# Patient Record
Sex: Male | Born: 1955
Health system: Southern US, Community
[De-identification: ages and names within clinical notes are randomized; demographics above are authoritative.]

## PROBLEM LIST (undated history)

## (undated) DIAGNOSIS — C61 Malignant neoplasm of prostate: Secondary | ICD-10-CM

## (undated) DIAGNOSIS — E049 Nontoxic goiter, unspecified: Secondary | ICD-10-CM

## (undated) DIAGNOSIS — M1990 Unspecified osteoarthritis, unspecified site: Secondary | ICD-10-CM

## (undated) DIAGNOSIS — G4730 Sleep apnea, unspecified: Secondary | ICD-10-CM

## (undated) DIAGNOSIS — E119 Type 2 diabetes mellitus without complications: Secondary | ICD-10-CM

## (undated) DIAGNOSIS — H5702 Anisocoria: Secondary | ICD-10-CM

## (undated) DIAGNOSIS — Z973 Presence of spectacles and contact lenses: Secondary | ICD-10-CM

## (undated) DIAGNOSIS — K648 Other hemorrhoids: Secondary | ICD-10-CM

## (undated) DIAGNOSIS — K5790 Diverticulosis of intestine, part unspecified, without perforation or abscess without bleeding: Secondary | ICD-10-CM

## (undated) DIAGNOSIS — E039 Hypothyroidism, unspecified: Secondary | ICD-10-CM

## (undated) DIAGNOSIS — Z87442 Personal history of urinary calculi: Secondary | ICD-10-CM

## (undated) DIAGNOSIS — C4491 Basal cell carcinoma of skin, unspecified: Secondary | ICD-10-CM

## (undated) DIAGNOSIS — R7303 Prediabetes: Secondary | ICD-10-CM

## (undated) DIAGNOSIS — T148XXA Other injury of unspecified body region, initial encounter: Secondary | ICD-10-CM

## (undated) DIAGNOSIS — F988 Other specified behavioral and emotional disorders with onset usually occurring in childhood and adolescence: Secondary | ICD-10-CM

## (undated) DIAGNOSIS — E785 Hyperlipidemia, unspecified: Secondary | ICD-10-CM

## (undated) DIAGNOSIS — R9431 Abnormal electrocardiogram [ECG] [EKG]: Secondary | ICD-10-CM

## (undated) DIAGNOSIS — M720 Palmar fascial fibromatosis [Dupuytren]: Secondary | ICD-10-CM

## (undated) DIAGNOSIS — Z8669 Personal history of other diseases of the nervous system and sense organs: Secondary | ICD-10-CM

## (undated) HISTORY — DX: Other hemorrhoids: K64.8

## (undated) HISTORY — DX: Type 2 diabetes mellitus without complications: E11.9

## (undated) HISTORY — PX: HERNIA REPAIR: SHX51

## (undated) HISTORY — DX: Anisocoria: H57.02

## (undated) HISTORY — PX: OTHER SURGICAL HISTORY: SHX169

## (undated) HISTORY — DX: Basal cell carcinoma of skin, unspecified: C44.91

## (undated) HISTORY — PX: TONSILLECTOMY: SUR1361

## (undated) HISTORY — DX: Other specified behavioral and emotional disorders with onset usually occurring in childhood and adolescence: F98.8

## (undated) HISTORY — PX: COLONOSCOPY: SHX174

## (undated) HISTORY — DX: Personal history of other diseases of the nervous system and sense organs: Z86.69

## (undated) HISTORY — PX: VASECTOMY: SHX75

## (undated) HISTORY — DX: Other injury of unspecified body region, initial encounter: T14.8XXA

## (undated) HISTORY — DX: Abnormal electrocardiogram (ECG) (EKG): R94.31

## (undated) HISTORY — DX: Diverticulosis of intestine, part unspecified, without perforation or abscess without bleeding: K57.90

## (undated) HISTORY — DX: Hyperlipidemia, unspecified: E78.5

---

## 1978-06-06 HISTORY — PX: KNEE ARTHROSCOPY W/ ACL RECONSTRUCTION: SHX1858

## 2004-02-16 ENCOUNTER — Ambulatory Visit (HOSPITAL_BASED_OUTPATIENT_CLINIC_OR_DEPARTMENT_OTHER): Admission: RE | Admit: 2004-02-16 | Discharge: 2004-02-16 | Payer: Self-pay | Admitting: Internal Medicine

## 2004-04-08 ENCOUNTER — Ambulatory Visit: Payer: Self-pay | Admitting: Internal Medicine

## 2004-06-06 HISTORY — PX: COLONOSCOPY: SHX174

## 2005-05-11 ENCOUNTER — Ambulatory Visit: Payer: Self-pay | Admitting: Internal Medicine

## 2005-06-21 ENCOUNTER — Ambulatory Visit: Payer: Self-pay | Admitting: Pulmonary Disease

## 2005-07-19 ENCOUNTER — Ambulatory Visit: Payer: Self-pay | Admitting: Pulmonary Disease

## 2006-02-09 ENCOUNTER — Ambulatory Visit: Payer: Self-pay | Admitting: Internal Medicine

## 2006-03-03 ENCOUNTER — Ambulatory Visit: Payer: Self-pay | Admitting: Internal Medicine

## 2006-06-06 HISTORY — PX: KNEE SURGERY: SHX244

## 2006-08-11 ENCOUNTER — Ambulatory Visit (HOSPITAL_COMMUNITY): Admission: RE | Admit: 2006-08-11 | Discharge: 2006-08-11 | Payer: Self-pay | Admitting: Specialist

## 2006-08-17 ENCOUNTER — Ambulatory Visit: Payer: Self-pay | Admitting: Internal Medicine

## 2006-08-17 LAB — CONVERTED CEMR LAB
Basophils Relative: 0.1 % (ref 0.0–1.0)
Bilirubin, Direct: 0.1 mg/dL (ref 0.0–0.3)
CO2: 30 meq/L (ref 19–32)
Cholesterol: 218 mg/dL (ref 0–200)
Creatinine, Ser: 0.9 mg/dL (ref 0.4–1.5)
Direct LDL: 151.4 mg/dL
Eosinophils Relative: 0.5 % (ref 0.0–5.0)
GFR calc Af Amer: 114 mL/min
Glucose, Bld: 111 mg/dL — ABNORMAL HIGH (ref 70–99)
HCT: 44.7 % (ref 39.0–52.0)
Hemoglobin: 15.3 g/dL (ref 13.0–17.0)
Lymphocytes Relative: 17.2 % (ref 12.0–46.0)
Monocytes Absolute: 0.4 10*3/uL (ref 0.2–0.7)
Monocytes Relative: 4.5 % (ref 3.0–11.0)
Neutro Abs: 7 10*3/uL (ref 1.4–7.7)
Neutrophils Relative %: 77.7 % — ABNORMAL HIGH (ref 43.0–77.0)
PSA: 0.77 ng/mL (ref 0.10–4.00)
Potassium: 4 meq/L (ref 3.5–5.1)
Sodium: 140 meq/L (ref 135–145)
Total Bilirubin: 1 mg/dL (ref 0.3–1.2)
Total Protein: 6.9 g/dL (ref 6.0–8.3)
VLDL: 29 mg/dL (ref 0–40)
WBC: 8.9 10*3/uL (ref 4.5–10.5)

## 2006-08-28 ENCOUNTER — Ambulatory Visit: Payer: Self-pay | Admitting: Internal Medicine

## 2006-11-20 ENCOUNTER — Ambulatory Visit: Payer: Self-pay | Admitting: Internal Medicine

## 2006-11-23 ENCOUNTER — Ambulatory Visit: Payer: Self-pay | Admitting: Internal Medicine

## 2006-11-23 LAB — CONVERTED CEMR LAB
AST: 16 units/L (ref 0–37)
Hgb A1c MFr Bld: 6.1 % — ABNORMAL HIGH (ref 4.6–6.0)
LDL Cholesterol: 89 mg/dL (ref 0–99)
VLDL: 14 mg/dL (ref 0–40)

## 2007-05-08 ENCOUNTER — Ambulatory Visit: Payer: Self-pay | Admitting: Internal Medicine

## 2007-05-08 LAB — CONVERTED CEMR LAB
ALT: 28 units/L (ref 0–53)
AST: 19 units/L (ref 0–37)
Cholesterol: 166 mg/dL (ref 0–200)
Hgb A1c MFr Bld: 5.9 % (ref 4.6–6.0)
LDL Cholesterol: 107 mg/dL — ABNORMAL HIGH (ref 0–99)
Total CHOL/HDL Ratio: 3.7

## 2007-05-11 ENCOUNTER — Ambulatory Visit: Payer: Self-pay | Admitting: Internal Medicine

## 2007-05-11 DIAGNOSIS — E785 Hyperlipidemia, unspecified: Secondary | ICD-10-CM | POA: Insufficient documentation

## 2007-05-11 LAB — CONVERTED CEMR LAB
HDL goal, serum: 40 mg/dL
LDL Goal: 130 mg/dL

## 2008-07-16 ENCOUNTER — Telehealth (INDEPENDENT_AMBULATORY_CARE_PROVIDER_SITE_OTHER): Payer: Self-pay | Admitting: *Deleted

## 2008-10-16 ENCOUNTER — Ambulatory Visit: Payer: Self-pay | Admitting: Internal Medicine

## 2008-10-16 LAB — CONVERTED CEMR LAB
ALT: 22 units/L (ref 0–53)
Bilirubin, Direct: 0.1 mg/dL (ref 0.0–0.3)
HDL: 38.2 mg/dL — ABNORMAL LOW (ref >39.00)
Hgb A1c MFr Bld: 6 % (ref 4.6–6.5)
PSA: 1.16 ng/mL (ref 0.10–4.00)
TSH: 2.61 microintl units/mL (ref 0.35–5.50)
Total Bilirubin: 1.2 mg/dL (ref 0.3–1.2)
Total CHOL/HDL Ratio: 4
VLDL: 14 mg/dL (ref 0.0–40.0)

## 2008-10-28 ENCOUNTER — Ambulatory Visit: Payer: Self-pay | Admitting: Internal Medicine

## 2008-10-28 DIAGNOSIS — S5400XA Injury of ulnar nerve at forearm level, unspecified arm, initial encounter: Secondary | ICD-10-CM | POA: Insufficient documentation

## 2008-10-28 DIAGNOSIS — Z85828 Personal history of other malignant neoplasm of skin: Secondary | ICD-10-CM | POA: Insufficient documentation

## 2008-10-28 DIAGNOSIS — K5730 Diverticulosis of large intestine without perforation or abscess without bleeding: Secondary | ICD-10-CM | POA: Insufficient documentation

## 2008-10-28 DIAGNOSIS — N400 Enlarged prostate without lower urinary tract symptoms: Secondary | ICD-10-CM | POA: Insufficient documentation

## 2009-02-13 ENCOUNTER — Telehealth (INDEPENDENT_AMBULATORY_CARE_PROVIDER_SITE_OTHER): Payer: Self-pay | Admitting: *Deleted

## 2009-10-14 ENCOUNTER — Telehealth: Payer: Self-pay | Admitting: Internal Medicine

## 2009-11-12 ENCOUNTER — Telehealth (INDEPENDENT_AMBULATORY_CARE_PROVIDER_SITE_OTHER): Payer: Self-pay | Admitting: *Deleted

## 2009-11-25 ENCOUNTER — Telehealth (INDEPENDENT_AMBULATORY_CARE_PROVIDER_SITE_OTHER): Payer: Self-pay | Admitting: *Deleted

## 2010-02-15 ENCOUNTER — Telehealth (INDEPENDENT_AMBULATORY_CARE_PROVIDER_SITE_OTHER): Payer: Self-pay | Admitting: *Deleted

## 2010-02-19 ENCOUNTER — Ambulatory Visit: Payer: Self-pay | Admitting: Internal Medicine

## 2010-02-19 LAB — CONVERTED CEMR LAB
Bilirubin Urine: NEGATIVE
Glucose, Urine, Semiquant: NEGATIVE
Ketones, urine, test strip: NEGATIVE
Specific Gravity, Urine: 1.015
pH: 6

## 2010-02-22 LAB — CONVERTED CEMR LAB
ALT: 23 units/L (ref 0–53)
AST: 21 units/L (ref 0–37)
Albumin: 4.3 g/dL (ref 3.5–5.2)
Alkaline Phosphatase: 47 units/L (ref 39–117)
Basophils Absolute: 0 10*3/uL (ref 0.0–0.1)
Bilirubin, Direct: 0.1 mg/dL (ref 0.0–0.3)
Calcium: 9.1 mg/dL (ref 8.4–10.5)
Chloride: 109 meq/L (ref 96–112)
Cholesterol: 136 mg/dL (ref 0–200)
Creatinine, Ser: 1 mg/dL (ref 0.4–1.5)
Eosinophils Absolute: 0.2 10*3/uL (ref 0.0–0.7)
Hemoglobin: 15.3 g/dL (ref 13.0–17.0)
LDL Cholesterol: 87 mg/dL (ref 0–99)
Lymphocytes Relative: 29.1 % (ref 12.0–46.0)
MCHC: 34.3 g/dL (ref 30.0–36.0)
Monocytes Relative: 6.5 % (ref 3.0–12.0)
Neutro Abs: 3.4 10*3/uL (ref 1.4–7.7)
PSA: 1.39 ng/mL (ref 0.10–4.00)
Platelets: 199 10*3/uL (ref 150.0–400.0)
RDW: 13.3 % (ref 11.5–14.6)
Sodium: 141 meq/L (ref 135–145)
TSH: 2.28 microintl units/mL (ref 0.35–5.50)
Total Protein: 6.5 g/dL (ref 6.0–8.3)
Triglycerides: 62 mg/dL (ref 0.0–149.0)

## 2010-02-23 LAB — CONVERTED CEMR LAB: Hgb A1c MFr Bld: 6.6 % — ABNORMAL HIGH (ref 4.6–6.5)

## 2010-03-01 ENCOUNTER — Ambulatory Visit: Payer: Self-pay | Admitting: Internal Medicine

## 2010-03-01 LAB — CONVERTED CEMR LAB: OCCULT 2: NEGATIVE

## 2010-03-02 ENCOUNTER — Encounter (INDEPENDENT_AMBULATORY_CARE_PROVIDER_SITE_OTHER): Payer: Self-pay | Admitting: *Deleted

## 2010-03-03 ENCOUNTER — Encounter: Payer: Self-pay | Admitting: Internal Medicine

## 2010-03-03 ENCOUNTER — Ambulatory Visit: Payer: Self-pay | Admitting: Internal Medicine

## 2010-03-03 DIAGNOSIS — E118 Type 2 diabetes mellitus with unspecified complications: Secondary | ICD-10-CM | POA: Insufficient documentation

## 2010-03-03 DIAGNOSIS — E042 Nontoxic multinodular goiter: Secondary | ICD-10-CM | POA: Insufficient documentation

## 2010-03-05 ENCOUNTER — Encounter: Admission: RE | Admit: 2010-03-05 | Discharge: 2010-03-05 | Payer: Self-pay | Admitting: Internal Medicine

## 2010-03-12 ENCOUNTER — Telehealth: Payer: Self-pay | Admitting: Internal Medicine

## 2010-03-12 DIAGNOSIS — E041 Nontoxic single thyroid nodule: Secondary | ICD-10-CM | POA: Insufficient documentation

## 2010-03-30 ENCOUNTER — Encounter: Admission: RE | Admit: 2010-03-30 | Discharge: 2010-03-30 | Payer: Self-pay | Admitting: Internal Medicine

## 2010-03-30 ENCOUNTER — Other Ambulatory Visit: Admission: RE | Admit: 2010-03-30 | Discharge: 2010-03-30 | Payer: Self-pay | Admitting: Interventional Radiology

## 2010-03-30 ENCOUNTER — Encounter: Payer: Self-pay | Admitting: Internal Medicine

## 2010-04-05 ENCOUNTER — Telehealth: Payer: Self-pay | Admitting: Internal Medicine

## 2010-04-16 ENCOUNTER — Telehealth: Payer: Self-pay | Admitting: Internal Medicine

## 2010-05-21 ENCOUNTER — Ambulatory Visit: Payer: Self-pay | Admitting: Internal Medicine

## 2010-05-21 LAB — CONVERTED CEMR LAB
Creatinine,U: 193.6 mg/dL
Hgb A1c MFr Bld: 5.8 % (ref 4.6–6.5)
Microalb Creat Ratio: 0.5 mg/g (ref 0.0–30.0)
Potassium: 4.3 meq/L (ref 3.5–5.1)

## 2010-05-26 ENCOUNTER — Ambulatory Visit: Payer: Self-pay | Admitting: Internal Medicine

## 2010-05-26 ENCOUNTER — Telehealth (INDEPENDENT_AMBULATORY_CARE_PROVIDER_SITE_OTHER): Payer: Self-pay | Admitting: *Deleted

## 2010-07-06 NOTE — Progress Notes (Signed)
Summary: refillr request  Phone Note Refill Request Message from:  Scriptline on February 15, 2010 9:25 AM  Refills Requested: Medication #1:  PRAVASTATIN SODIUM 40 MG  TABS 1 by mouth qd   Dosage confirmed as above?Dosage Confirmed   Supply Requested: 1 month  Medication #2:  LEVITRA 20 MG TABS as needed   Dosage confirmed as above?Dosage Confirmed   Supply Requested: 1 month [facility] [address]  Next Appointment Scheduled: 03/03/10 Initial call taken by: Lavell Islam,  February 15, 2010 9:25 AM    Prescriptions: LEVITRA 20 MG TABS (VARDENAFIL HCL) as needed  #6 x 0   Entered by:   Shonna Chock CMA   Authorized by:   Marga Melnick MD   Signed by:   Shonna Chock CMA on 02/15/2010   Method used:   Electronically to        [facility] [address] [place] (retail)       [phone]       [place], [place]  [address]       Ph: [phone]       Fax: [phone]   RxID:   [id number] PRAVASTATIN SODIUM 40 MG  TABS (PRAVASTATIN SODIUM) 1 by mouth qd  #90 x 0   Entered by:   Shonna Chock CMA   Authorized by:   Marga Melnick MD   Signed by:   Shonna Chock CMA on 02/15/2010   Method used:   Electronically to        [facility] [address] [place] (retail)       [phone]       [place], [place]  [address]       Ph: [phone]       Fax: [phone]   RxID:   [id number]

## 2010-07-06 NOTE — Progress Notes (Signed)
Summary: patient wants procedure  Phone Note Call from Patient Call back at [phone]   Caller: Patient Summary of Call: patient received this note on ultra sound result - Onalee Hua, the safest & most prudent course would be  to perform fine needle aspirations of these 2 large nodules in the L thyroid lobe.Please think &  pray over this & let me know if I may schedule this procedure. Hopp PATIENT WANTS TO HAVE PROCEDURE - SET UP & CALL PATIENT ON CELL [phone] Initial call taken by: Okey Regal Spring,  March 12, 2010 4:51 PM  Follow-up for Phone Call        PROCEDURE SCH'D FOR 03-30-2010 [facility] & PATIENT IS AWARE. Follow-up by: Magdalen Spatz [facility],  March 19, 2010 1:38 PM  New Problems: THYROID NODULE (ICD-241.0)   New Problems: THYROID NODULE (ICD-241.0)

## 2010-07-06 NOTE — Progress Notes (Signed)
Summary: Metformin  Phone Note Call from Patient Call back at [phone]   Summary of Call: Patient left message on triage at 4pm that he is taking Metformin and is has made him constipated. Patient has taken laxative 2x/week due to the med. Is this ok or should he take something else, please advise. Initial call taken by: Lucious Groves CMA,  April 16, 2010 5:08 PM  Follow-up for Phone Call        Per md the patient was notified to take the med halfway through the meal and that should fix it. Patient will try this a few days and call me back with update. He also was taking it with breakfast and supper (not his largestest meals). I advised he take as precribed and he will now take with his two largest meals, lunch and dinner. He will call back with update. Follow-up by: Lucious Groves CMA,  April 16, 2010 5:23 PM

## 2010-07-06 NOTE — Progress Notes (Signed)
Summary: lab order  Phone Note Call from Patient   Summary of Call: patient has cpx scheduled [id number] - lab scheduled [phone] --- need lab order Initial call taken by: Okey Regal Spring,  November 12, 2009 8:44 AM  Follow-up for Phone Call        Lipid,Hepatic,CBCD,BMP,TSH,PSA, Udip, Stool Cards v70.0/272.2/995.20 Follow-up by: Shonna Chock,  November 12, 2009 10:36 AM  Additional Follow-up for Phone Call Additional follow up Details #1::        labs added tpo appt .Marland KitchenOkey Regal Spring  November 13, 2009 3:40 PM  Additional Follow-up by: Okey Regal Spring,  November 13, 2009 3:40 PM

## 2010-07-06 NOTE — Progress Notes (Signed)
Summary: refill  Phone Note Refill Request Message from:  Patient on November 12, 2009 8:41 AM  Refills Requested: Medication #1:  PRAVASTATIN SODIUM 40 MG  TABS 1 by mouth qd patient wants 90day supply called in to [facility] [address]   Method Requested: Fax to Local Pharmacy Next Appointment Scheduled: cpx [phone] Initial call taken by: Okey Regal Spring,  November 12, 2009 8:42 AM    Prescriptions: PRAVASTATIN SODIUM 40 MG  TABS (PRAVASTATIN SODIUM) 1 by mouth qd  #90 x 0   Entered by:   Shonna Chock   Authorized by:   Marga Melnick MD   Signed by:   Shonna Chock on 11/12/2009   Method used:   Faxed to ...       [facility] [address] (retail)       [phone]       [place], [place]  [address]       Ph: [phone]       Fax: [phone]   RxID:   [id number]

## 2010-07-06 NOTE — Assessment & Plan Note (Signed)
Summary: cpx/cbs   Vital Signs:  Patient profile:   55 year old male Height:      76 inches Weight:      251.8 pounds BMI:     30.76 Temp:     98.6 degrees F oral Pulse rate:   64 / minute Resp:     14 per minute BP sitting:   122 / 78  (left arm) Cuff size:   large  Vitals Entered By: Shonna Chock CMA (March 03, 2010 8:38 AM)    History of Present Illness:      Kenneth Shaw is here for a physical; he is essentially asymptomatic but A1c indicates progression  to frank  DM. The patient reports weight gain of 30 # with decreased CVE & increased candy 06/2009 to 08/15. He  denies polyuria, polydipsia, blurred vision, and numbness of extremities.  The patient denies the following symptoms: neuropathic pain, chest pain, vomiting, orthostatic symptoms, poor wound healing, intermittent claudication, vision loss, and foot ulcer.  The patient reports good dietary compliance and exercising regularly as of 08/15 with weight loss of 13#.  The patient has been measuring capillary blood glucose before breakfast; average 135.  Since the last visit, the patient reports having had no eye care.  A1c 6.6% ( average sugar 143, risk 32%). Hyperlipidemia Follow-Up      The patient also presents for Hyperlipidemia follow-up.  The patient denies muscle aches, GI upset, abdominal pain, flushing, itching, constipation, and diarrhea.  The patient denies the following symptoms: exercise intolerance, dypsnea, palpitations, syncope, and pedal edema.  Compliance with medications (by patient report) has been near 100%.  Adjunctive measures currently used by the patient include ASA and niacin.  LDL is  @ goal (87).  Current Medications (verified): 1)  Levitra 20 Mg Tabs (Vardenafil Hcl) .... As Needed 2)  Pravastatin Sodium 40 Mg  Tabs (Pravastatin Sodium) .Marland Kitchen.. 1 By Mouth Qd 3)  Adult Aspirin Low Strength 81 Mg  Tbdp (Aspirin) .Marland Kitchen.. 1 By Mouth Qd 4)  Multivitamin .Marland Kitchen.. 1 By Mouth Qd 5)  Slo Niacin .Marland Kitchen.. 1 By Mouth  Qam  Allergies (verified): No Known Drug Allergies  Past History:  Past Medical History: anisocoria,chronic; early repolarization ST-T changes; elevated  liver enzymes,PMH of; Fasting hyperglycemia (790.29), PMH of . A1c 6.6% 02/2010. Hyperlipidemia Sleep apnea, BiPAP Skin cancer, hx of,basal cell, Dr Jorja Loa Diverticulosis, colon, 2007,due 2017 ADD( no official testing)  Past Surgical History:  ACL  L knee 1980 Fracture of left arm, no surgery Arthroscopy left knee 08/2006 Tonsillectomy Vasectomy Inguinal herniorrhaphy,bilaterally Colonoscopy negative 2006  Family History: MGM: DM; M aunt: DM Father: Sleep Apnea, Asbestosis Siblings:sister: bipolar disorder M: lipids,HTN,thyroid disease(inactive),AF; 2 M uncles: MI (@56  & 67);MGF: MI @ 70;PGM: bone cancer,Gyn cancer; P uncle : bone cancer; M aunt: ovarian cancer  Social History: Former Smoker: quit  1981 Occupation:Chief Radiographer, therapeutic Married Alcohol use-no Regular exercise-yes: swimming 1 mpd  Review of Systems  The patient denies anorexia, fever, decreased hearing, hoarseness, prolonged cough, headaches, hemoptysis, melena, hematochezia, severe indigestion/heartburn, hematuria, depression, unusual weight change, abnormal bleeding, enlarged lymph nodes, and angioedema.    Physical Exam  General:  well-nourished; alert,appropriate and cooperative throughout examination Head:  Normocephalic and atraumatic without obvious abnormalities. No apparent alopecia; moustache Eyes:  No corneal or conjunctival inflammation noted. EOMI. Perrla. Funduscopic exam benign, without hemorrhages, exudates or papilledema. Vision grossly normal. Ears:  External ear exam shows no significant lesions or deformities.  Otoscopic examination reveals clear canals, tympanic membranes  are intact bilaterally without bulging, retraction, inflammation or discharge. Hearing is grossly normal bilaterally. Nose:  External nasal examination shows no  deformity or inflammation. Nasal mucosa are pink and moist without lesions or exudates. Septal dislocation Mouth:  Oral mucosa and oropharynx without lesions or exudates.  Teeth in good repair. Neck:  No deformities, masses, or tenderness noted. ? L goiter, R lobe small Lungs:  Normal respiratory effort, chest expands symmetrically. Lungs are clear to auscultation, no crackles or wheezes. Heart:  regular rhythm, no murmur, no gallop, no rub, no JVD, no HJR, and bradycardia.   Abdomen:  Bowel sounds positive,abdomen soft and non-tender without masses, organomegaly or hernias noted. Rectal:  No external abnormalities noted. Normal sphincter tone. No rectal masses or tenderness. Genitalia:  Testes bilaterally descended without nodularity, tenderness or masses. No scrotal masses or lesions. No penis lesions or urethral discharge. L varicocele.   Prostate:  Prostate gland firm and smooth, no enlargement, nodularity, tenderness, mass, asymmetry or induration. Msk:  No deformity or scoliosis noted of thoracic or lumbar spine.   Pulses:  R and L carotid,radial,dorsalis pedis and posterior tibial pulses are full and equal bilaterally Extremities:  No clubbing, cyanosis, edema, or deformity noted with normal full range of motion of all joints.   Thickening of L palmar tendons , connective tissues  Neurologic:  alert & oriented X3, sensation intact to light touch over feet, and DTRs symmetrical and normal.   Skin:  Intact without suspicious lesions or rashes Cervical Nodes:  No lymphadenopathy noted Axillary Nodes:  No palpable lymphadenopathy Inguinal Nodes:  No significant adenopathy Psych:  Cognition and judgment appear intact. Alert and cooperative with normal attention span and concentration. No apparent delusions, illusions, hallucinations   Impression & Recommendations:  Problem # 1:  ROUTINE GENERAL MEDICAL EXAM@HEALTH  CARE FACL (ICD-V70.0)  Orders: EKG w/ Interpretation (93000)  Problem # 2:   PRE-DIABETES (ICD-790.29)  His updated medication list for this problem includes:    Metformin Hcl 500 Mg Tabs (Metformin hcl) .Marland Kitchen... 1 two times a day with 2 largest meals  Problem # 3:  HYPERLIPIDEMIA (ICD-272.2)  His updated medication list for this problem includes:    Pravastatin Sodium 40 Mg Tabs (Pravastatin sodium) .Marland Kitchen... 1 by mouth qd  Problem # 4:  GOITER, UNSPECIFIED (ICD-240.9)  Orders: Radiology Referral (Radiology)  Complete Medication List: 1)  Levitra 20 Mg Tabs (Vardenafil hcl) .... As needed 2)  Pravastatin Sodium 40 Mg Tabs (Pravastatin sodium) .Marland Kitchen.. 1 by mouth qd 3)  Adult Aspirin Low Strength 81 Mg Tbdp (Aspirin) .Marland Kitchen.. 1 by mouth qd 4)  Multivitamin  .Marland Kitchen.. 1 by mouth qd 5)  Slo Niacin  .Marland Kitchen.. 1 by mouth qam 6)  Metformin Hcl 500 Mg Tabs (Metformin hcl) .Marland Kitchen.. 1 two times a day with 2 largest meals  Patient Instructions: 1)  Check your blood sugars regularly. If your readings are usually above :150 or below 90 you should contact our office.Two hrs after a meal < 180, ideally < 160. 2)  See your eye doctor yearly to check for diabetic eye damage. 3)  Check your feet each night for sore areas, calluses or signs of infection. 4)  Please schedule a follow-up appointment in 3 months. 5)  BUN,creat , K+ prior to visit, ICD-9:995.20 6)  HbgA1C prior to visit, ICD-9:790.29 7)  Urine Microalbumin prior to visit, ICD-9:790.29. Low carb  diet as discussed Prescriptions: METFORMIN HCL 500 MG TABS (METFORMIN HCL) 1 two times a day with 2 largest meals  #  180 x 0   Entered and Authorized by:   Marga Melnick MD   Signed by:   Marga Melnick MD on 03/03/2010   Method used:   Print then Give to Patient   RxID:   [id number] LEVITRA 20 MG TABS (VARDENAFIL HCL) as needed  #6 x 6   Entered and Authorized by:   Marga Melnick MD   Signed by:   Marga Melnick MD on 03/03/2010   Method used:   Print then Give to Patient   RxID:   [id number] PRAVASTATIN SODIUM 40 MG  TABS  (PRAVASTATIN SODIUM) 1 by mouth qd  #90 x 3   Entered and Authorized by:   Marga Melnick MD   Signed by:   Marga Melnick MD on 03/03/2010   Method used:   Print then Give to Patient   RxID:   [id number]

## 2010-07-06 NOTE — Progress Notes (Signed)
Summary: Test Results  Phone Note Call from Patient Call back at [phone]   Summary of Call: Patient called and wanted to know what the results were for the testing he had done last week for his thyriod. Please advise results. Initial call taken by: Harold Barban,  April 05, 2010 10:55 AM  Follow-up for Phone Call        pleaseask Pathology to FAX path report from thyroid aspiration Follow-up by: Marga Melnick MD,  April 05, 2010 1:11 PM  Additional Follow-up for Phone Call Additional follow up Details #1::        I called patient with a status update to let him know I will follow-up on pathology results and contact him tomorrow.   I called [facility] and spoke with Tammy and she indicate she will fax results of pathology Additional Follow-up by: Shonna Chock CMA,  April 05, 2010 4:56 PM    Additional Follow-up for Phone Call Additional follow up Details #2::    report was not signed off until 10/27 & is still not in computer.Requested  FAXed report reveals NON malignant goiter. Annual manul  exam & full thyroid function tests needed . Also [place] will be ordered  as needed  based on those findings. Hopp Follow-up by: Marga Melnick MD,  April 06, 2010 6:25 AM   Appended Document: Test Results I spoke with patient and informed him of Dr.Kermitt Harjo's response and he ok'd information

## 2010-07-06 NOTE — Progress Notes (Signed)
Summary: Refill Request  Phone Note Call from Patient Call back at [phone]   Summary of Call: Message left on VM: Patient would like Levitra filled. [facility] [address]  I spoke with patient and indicated that we have Cialis on med list. Patient states he tried Cialis and it doesnt work as good as Orthoptist and he would like 1 refill on Levitra until his pending appointment 03/03/2010.  Last OV 10/2008  Dr.Hopper please advise   Follow-up for Phone Call        Per Dr.Hopper ok for patient to have Levitra Follow-up by: Shonna Chock,  November 25, 2009 2:21 PM    New/Updated Medications: LEVITRA 20 MG TABS (VARDENAFIL HCL) as needed Prescriptions: LEVITRA 20 MG TABS (VARDENAFIL HCL) as needed  #6 x 0   Entered by:   Shonna Chock   Authorized by:   Marga Melnick MD   Signed by:   Shonna Chock on 11/25/2009   Method used:   Electronically to        [facility] [address] [place] (retail)       [phone]       [place], [place]  [address]       Ph: [phone]       Fax: [phone]   RxID:   [id number]

## 2010-07-06 NOTE — Progress Notes (Signed)
Summary: RX hemorroid   Phone Note Call from Patient   Caller: Patient Summary of Call: pt call requesting a rx for his hemorrhoid. pt states that the pharmacy recommend some type of foam that has to be rx. Pt last OV 10-28-08 with no pending appt. pt uses [facility]. pls advise..........Marland KitchenFelecia Deloach CMA  Oct 14, 2009 3:26 PM   Follow-up for Phone Call        pt aware rx sent to pharmacy..........Marland KitchenFelecia Deloach CMA  Oct 14, 2009 4:33 PM     New/Updated Medications: PROCTOFOAM HC 1-1 % FOAM (HYDROCORTISONE ACE-PRAMOXINE) apply two times a day after [employer] Prescriptions: PROCTOFOAM HC 1-1 % FOAM (HYDROCORTISONE ACE-PRAMOXINE) apply two times a day after [employer]  #1 x 0   Entered by:   Jeremy Johann CMA   Authorized by:   Marga Melnick MD   Signed by:   Jeremy Johann CMA on 10/14/2009   Method used:   Faxed to ...       [facility] [address] (retail)       [phone]       [place], [place]  [address]       Ph: [phone]       Fax: [phone]   RxID:   [id number]

## 2010-07-06 NOTE — Letter (Signed)
Summary: Results Follow up Letter  [facility]  [address] [place], [place] [address]   Phone: [phone]  Fax: [phone]    03/02/2010 MRN: [id number]  Izel Gaiser [address] [place], [place]  [address]  Dear Mr. MEYER,  The following are the results of your recent test(s):  Test         Result    Pap Smear:        Normal _____  Not Normal _____ Comments: ______________________________________________________ Cholesterol: LDL(Bad cholesterol):         Your goal is less than:         HDL (Good cholesterol):       Your goal is more than: Comments:  ______________________________________________________ Mammogram:        Normal _____  Not Normal _____ Comments:  ___________________________________________________________________ Hemoccult:        Normal __X___  Not normal _______ Comments:    _____________________________________________________________________ Other Tests:    We routinely do not discuss normal results over the telephone.  If you desire a copy of the results, or you have any questions about this information we can discuss them at your next office visit.   Sincerely,

## 2010-07-08 NOTE — Assessment & Plan Note (Signed)
Summary: rto 3 months/cbs   Vital Signs:  Patient profile:   55 year old male Weight:      236 pounds BMI:     28.83 Pulse rate:   66 / minute Resp:     14 per minute BP sitting:   116 / 76  (left arm) Cuff size:   large  Vitals Entered By: Shonna Chock CMA (May 26, 2010 9:11 AM) CC: 1.) 3 month folllow-up ( copy of labs given)  2.) Discuss Metformin and blood sugars (elevated in the morning) , Type 2 diabetes mellitus follow-up   CC:  1.) 3 month folllow-up ( copy of labs given)  2.) Discuss Metformin and blood sugars (elevated in the morning)  and Type 2 diabetes mellitus follow-up.  History of Present Illness: Pre Diabetes  Follow-Up:      This is a 55 year old man who presents for  pre  diabetes mellitus follow-up.  The patient reports weight loss of 30# since  08/15 with TLC, but denies polyuria, polydipsia, blurred vision, self managed hypoglycemia, and numbness of extremities.  The patient denies the following symptoms: neuropathic pain, chest pain, vomiting, orthostatic symptoms, poor wound healing, intermittent claudication, vision loss, and foot ulcer.  Since the last visit the patient reports good dietary compliance and exercising regularly as swimming 1 mile/ day 5X/week.  The patient has been measuring capillary blood glucose before breakfast, 105-145.  Since the last visit, the patient reports having had no eye care.  A1c is 5.8%( 120, risk 16%); it was 6.6% in 02/2010( 143, 32% risk).   Allergies (verified): No Known Drug Allergies  Physical Exam  General:  well-nourished;alert,appropriate and cooperative throughout examination Heart:  Normal rate and regular rhythm. S1 and S2 normal without gallop, murmur, click, rub .S4 Pulses:  R and L carotid,radial,dorsalis pedis and posterior tibial pulses are full and equal bilaterally Extremities:  No clubbing, cyanosis, edema, .Good nail health Neurologic:  alert & oriented X3 and sensation intact to light touch over feet.    Skin:  Intact without suspicious lesions or rashes Psych:  Focused & intelligent   Impression & Recommendations:  Problem # 1:  PRE-DIABETES (ICD-790.29) He has resolved his Diabetes risk; A1c is now in  NON Diabetes range His updated medication list for this problem includes:    Metformin Hcl 500 Mg Tabs (Metformin hcl) .Marland Kitchen... 1 two times a day with  largest meal  Problem # 2:  THYROID NODULE (ICD-241.0) negative biopsy  Complete Medication List: 1)  Levitra 20 Mg Tabs (Vardenafil hcl) .... As needed 2)  Pravastatin Sodium 40 Mg Tabs (Pravastatin sodium) .Marland Kitchen.. 1 by mouth qd 3)  Adult Aspirin Low Strength 81 Mg Tbdp (Aspirin) .Marland Kitchen.. 1 by mouth qd 4)  Multivitamin  .Marland Kitchen.. 1 by mouth qd 5)  Slo Niacin  .Marland Kitchen.. 1 by mouth qam 6)  Metformin Hcl 500 Mg Tabs (Metformin hcl) .Marland Kitchen.. 1 two times a day with  largest meal  Patient Instructions: 1)  Check your blood sugars regularly. If your readings are usually above : 150 or below 90 you should contact our office. 2)  See your eye doctor yearly to check for diabetic eye damage. 3)  Check your feet each night for sore areas, calluses or signs of infection. 4)  Please schedule a follow-up appointment in 4  months. 5)  HbgA1C prior to visit, ICD-9:250.00 6)  Urine Microalbumin prior to visit, ICD-9:250.00 Prescriptions: METFORMIN HCL 500 MG TABS (METFORMIN HCL) 1 two times a  day with  largest meal  #90 x 1   Entered and Authorized by:   Marga Melnick MD   Signed by:   Marga Melnick MD on 05/26/2010   Method used:   Print then Give to Patient   RxID:   [id number]    Orders Added: 1)  Est. Patient Level III [46962]  Appended Document: rto 3 months/cbs Metformin was to be changed to 1 by mouth once daily with largest meal, I updated med list and reprinted rx with correct insturction./ Shonna Chock CMA  May 26, 2010 9:58 AM

## 2010-07-08 NOTE — Progress Notes (Signed)
Summary: question on lab = 09/21/2010--added codes to lab  Phone Note Call from Patient   Caller: Patient Summary of Call: per instructions, I have scheduled patient for lab work for 09/21/2010 and Dr Alwyn Ren on 4/24  labs = Hbga1c and Urine Microalbumin  250.00----patient thought Dr Alwyn Ren said  he was supposed to have hepatic and lipid too.   please advise Initial call taken by: Jerolyn Shin,  May 26, 2010 10:19 AM  Follow-up for Phone Call        It would be good to reassess those as well (272.4, 995.20;fasting lipids, AST,ALT) in addition to the A1c & urine microalbumin (790.290 Follow-up by: Marga Melnick MD,  May 26, 2010 10:45 AM  Additional Follow-up for Phone Call Additional follow up Details #1::        added additional codes and orders to original lab info.Jerolyn Shin  May 26, 2010 11:33 AM Additional Follow-up by: Jerolyn Shin,  May 26, 2010 11:33 AM

## 2010-08-10 ENCOUNTER — Encounter: Payer: Self-pay | Admitting: Internal Medicine

## 2010-09-21 ENCOUNTER — Other Ambulatory Visit (INDEPENDENT_AMBULATORY_CARE_PROVIDER_SITE_OTHER): Payer: BC Managed Care – PPO

## 2010-09-21 DIAGNOSIS — E119 Type 2 diabetes mellitus without complications: Secondary | ICD-10-CM

## 2010-09-21 DIAGNOSIS — T887XXA Unspecified adverse effect of drug or medicament, initial encounter: Secondary | ICD-10-CM

## 2010-09-21 DIAGNOSIS — E785 Hyperlipidemia, unspecified: Secondary | ICD-10-CM

## 2010-09-21 LAB — AST: AST: 20 U/L (ref 0–37)

## 2010-09-21 LAB — ALT: ALT: 22 U/L (ref 0–53)

## 2010-09-21 LAB — MICROALBUMIN / CREATININE URINE RATIO
Creatinine,U: 147.2 mg/dL
Microalb, Ur: 3.8 mg/dL — ABNORMAL HIGH (ref 0.0–1.9)

## 2010-09-21 LAB — LIPID PANEL: Cholesterol: 145 mg/dL (ref 0–200)

## 2010-09-21 LAB — HEMOGLOBIN A1C: Hgb A1c MFr Bld: 6.3 % (ref 4.6–6.5)

## 2010-09-28 ENCOUNTER — Telehealth: Payer: Self-pay | Admitting: Internal Medicine

## 2010-09-28 ENCOUNTER — Encounter: Payer: Self-pay | Admitting: Internal Medicine

## 2010-09-28 ENCOUNTER — Ambulatory Visit (INDEPENDENT_AMBULATORY_CARE_PROVIDER_SITE_OTHER): Payer: BC Managed Care – PPO | Admitting: Internal Medicine

## 2010-09-28 DIAGNOSIS — E782 Mixed hyperlipidemia: Secondary | ICD-10-CM

## 2010-09-28 DIAGNOSIS — E119 Type 2 diabetes mellitus without complications: Secondary | ICD-10-CM

## 2010-09-28 NOTE — Telephone Encounter (Signed)
790.29/995.20/272.5/v70.0 Lipid/Hep/BMP/CBCD/TSH/A1c/PSA/Stool Cards,Udip

## 2010-09-28 NOTE — Progress Notes (Signed)
Subjective:    Patient ID: Kenneth Shaw, male    DOB: 05/17/1956, 55 y.o.   MRN: [id number]  HPI  Diabetes status assessment :Fasting or morning glucose range:  99-112 or average :  109    ;   Highest glucose 2 hours after any meal:  < 150 ;  Hypoglycemia :  no  .                                                           Excess thirst :no;  Excess hunger:  no ;  Excess urination:  no .                                            Lightheadedness with standing:  no ; Chest pain:  no ; Palpitations :no ;  Pain in  calves with walking:  no .                                                                                                                                                              Non healing skin  ulcers or sores,especially over the feet:  no ; Numbness or tingling or burning in feet : no.                                                                                                                                                 Significant change in  Weight : stable  ;Vision changes : no  Exercise : 6X/ week X 70 minutes ;  Nutrition/diet:  Low carb;   Medication compliance : yes ; Medication adverse  Effects:  no  ;  Eye exam : due ; Foot care : no ;  A1c/ urine microalbumin monitor:  A1c 6.3% (average 136/ risk 26%)      Review of Systems Sleep Apnea controlled with CPAP.  He did note some constipation on 500 mg of metformin twice a day.     Objective:   Physical Exam Gen.: Healthy and well-nourished in appearance. Alert, appropriate and cooperative throughout exam. Lungs: Normal respiratory effort; chest expands symmetrically. Lungs are clear to auscultation without rales, wheezes, or increased work of breathing. Heart: Normal rate and rhythm. Normal S1 and S2. No gallop, click, or rub. No  murmur. No clubbing, cyanosis, edema, or deformity noted. Joints normal. Nail health  good. Vascular: Carotid, radial  artery, dorsalis pedis and dorsalis posterior tibial pulses are full and equal. No bruits present. Neurologic: Alert and oriented x3.Light touch over feet.   Skin: Intact without suspicious lesions or rashes. Psych: Mood and affect are normal. Normally interactive                                                                                         Assessment & Plan:  Diabetes control is excellent; he expresses a desire to get his A1c down to 5.5.  Plan: He'll be given the option of taking a half of the metformin with breakfast and containing a full pill with evening meal. The A1c should be monitored every 6 months with a urine microalbumin.

## 2010-09-28 NOTE — Telephone Encounter (Signed)
Patient has cpx on 10-9 and labs on 10/2---what orders and codes do you want??   thanks

## 2010-09-28 NOTE — Patient Instructions (Signed)
We discussed options; if desired he can increase the metformin to 500 mg one half with breakfast and continue the 500 milligrams with evening meal. A1c and urine microalbumin should be checked every 6 months.

## 2010-09-29 NOTE — Telephone Encounter (Signed)
Added info to 10/2 labs

## 2010-10-22 NOTE — Procedures (Signed)
NAME:  Juhnke, Taison                 ACCOUNT NO.:  [id number]   MEDICAL RECORD NO.:  [id number]          PATIENT TYPE:  OUT   LOCATION:  SLEEP CENTER                 FACILITY:  [facility]   PHYSICIAN:  Marcelyn Bruins, M.D. [facility] DATE OF BIRTH:  1955/07/11   DATE OF ADMISSION:  02/16/2004  DATE OF DISCHARGE:  02/16/2004                              NOCTURNAL POLYSOMNOGRAM   REFERRING PHYSICIAN:  Titus Dubin. Alwyn Ren, M.D.   INDICATION FOR THIS STUDY:  Hypersomnia with sleep apnea.   SLEEP ARCHITECTURE:  The patient had a total sleep time of 296 minutes with  a sleep efficiency of only 59%.  The patient had decreased REM and very  little slow wave sleep.  Sleep onset latency was mildly prolonged at 32  minutes with a REM latency that was very prolonged at 201 minutes.   IMPRESSION:  1.  Split night study revealed severe obstructive sleep apnea with 165      obstructive events noted in the first 162 minutes of sleep.  This gave      the patient an RDI of 61 events per hour with desaturation as low as      82%.  The events were not positional, but they were worse during REM.      There was moderate to loud snoring noted.  By split night criteria, the      patient was placed on a __________ mask and titrated to a final pressure      of 14 cm.  At this pressure, snoring was eliminated, however, the      patient continued to have persistent obstructive events that were quite      significant.  Because of the patient's frequent awakenings and also the      lack of time associated with a split night study, optimal CPAP pressure      was not able to be determined.  I would recommend an initial starting      pressure of 16 cm.  2.  Occasional PVC and PAC.  3.  Small numbers of leg jerks with mild sleep disruption.      KC/MEDQ  D:  03/01/2004 11:09:23  T:  03/01/2004 15:23:10  Job:  [id number]

## 2010-10-22 NOTE — Op Note (Signed)
NAME:  Kenneth Shaw, Kenneth Shaw                 ACCOUNT NO.:  [id number]   MEDICAL RECORD NO.:  [id number]          PATIENT TYPE:  AMB   LOCATION:  DAY                          FACILITY:  [facility]   PHYSICIAN:  Jene Every, M.D.    DATE OF BIRTH:  1955/08/01   DATE OF PROCEDURE:  08/11/2006  DATE OF DISCHARGE:                               OPERATIVE REPORT   PREOPERATIVE DIAGNOSES:  1. Medial meniscus tear.  2. Chondromalacia of the medial femoral condyle.  3. Anterior cruciate ligament deficient knee.   POSTOPERATIVE DIAGNOSES:  1. Medial meniscus tear.  2. Chondromalacia of the medial femoral condyle.  3. Anterior cruciate ligament deficient knee.   PROCEDURE PERFORMED:  1. Left knee arthroscopy.  2. Chondroplasty of the medial femoral condyle.  3. Partial medial meniscectomy.  4. Chondroplasty of the medial femoral condyle and patella.  5. Debridement of the ACL.  6. Examine under anesthesia.   INDICATIONS FOR PROCEDURE:  The patient is a 55 year old with chronic  ACL insufficiency, recent knee pain, locking and giving way.  MRI  indicating a possible grade 4 lesion of the medial femoral condyle.  Medial meniscal tear.  Operative intervention is indicated for  diagnosis, treatment and reconstruction of the ACL; possible  microfracture technique.  The risks and benefits were discussed,  including bleeding,  infection, damage to neurovascular structures,  suboptimal range of motion and need for repeat debridement or total knee  arthroplasty in the future.  Also, microfracture technique.   DESCRIPTION OF PROCEDURE:  The patient taken to operating room and  placed in the supine position, after induction of adequate general  anesthesia and 2 grams of Kefzol.  The left lower extremity was prepped  and draped in the usual sterile fashion.  A lateral parapatellar portal  and a superomedial parapatellar portal was fashioned with a #11 blade.  Ingress cannula atraumatically placed.  Irrigant  was utilized to  insufflate the joint prior to that.  We had a 1+ Lachman and minimal  pivot shift.  Under direct visualization a medial parapatellar portal  was fashioned with a #11 blade, after localization with an 18 gauge  needle and sparing the medial meniscus.  Under direct visualization  noted extensive grade 3 changes of the medial femoral condyle.  This was  probed with the knee in extension and in flexion.  I saw no grade 4  changes.  I introduced a 4-2 Cuda shaver to utilize and perform a  chondroplasty of the medial femoral condyle and of the chondral flap  tears.  I fully inspected and probed it.  I did not find a grade 4  lesion.  There was a posterior third medial meniscus tear, and I  introduced the basket rongeur and performed a partial medial  meniscectomy to utilize a stable base and further contoured with the 4-2  Cuda shaver.  The remnant was stable to probe palpation.  Minor grade 3  changes in the tibial plateau.  There was maceration and old tear of the  ACL; this was debrided.  The lateral compartment was essentially  unremarkable.  There was normal femoral condyle and tibial plateau  meniscus; stable to probe palpation without evidence of tear.  Gutters  were unremarkable.  I copiously lavaged the knee and reexamined the  medial meniscus; it was stable and there was no remnant.  I again  reexamined the femoral condyle and the tibial plateau; no evidence of  grade 4 lesions.  Therefore, a microfracture technique was not  performed.   Next, the knee was then scoped and lavaged.  All instrumentation was  removed.  Portals were closed with 4-0 nylon simple sutures.  Marcaine  0.25% with epinephrine was infiltrated into the joint.  It was dressed  sterilely.  He was awakened without difficulty and transported to the  recovery room in satisfactory condition.   The patient tolerated the procedure well and there were no  complications.      Jene Every, M.D.   Electronically Signed     JB/MEDQ  D:  08/11/2006  T:  08/12/2006  Job:  [id number]

## 2010-11-05 ENCOUNTER — Other Ambulatory Visit: Payer: Self-pay | Admitting: Internal Medicine

## 2010-11-05 MED ORDER — METFORMIN HCL 500 MG PO TABS: 500.0000 mg | ORAL_TABLET | Freq: Every day | ORAL | Status: DC

## 2010-11-05 NOTE — Telephone Encounter (Signed)
RX sent to pharmacy  

## 2011-03-01 ENCOUNTER — Other Ambulatory Visit: Payer: Self-pay | Admitting: Internal Medicine

## 2011-03-01 NOTE — Telephone Encounter (Signed)
Patient needs refill for test strips for free style lite - lancet --- [facility] [place]

## 2011-03-02 MED ORDER — FREESTYLE LANCETS MISC: Status: DC

## 2011-03-02 MED ORDER — GLUCOSE BLOOD VI STRP: ORAL_STRIP | Status: DC

## 2011-03-02 MED ORDER — METFORMIN HCL 500 MG PO TABS: 500.0000 mg | ORAL_TABLET | Freq: Every day | ORAL | Status: DC

## 2011-03-02 NOTE — Telephone Encounter (Signed)
Patient walked in requesting refills, rx's printed and given to patient

## 2011-03-07 ENCOUNTER — Other Ambulatory Visit: Payer: Self-pay | Admitting: Internal Medicine

## 2011-03-07 DIAGNOSIS — E785 Hyperlipidemia, unspecified: Secondary | ICD-10-CM

## 2011-03-07 DIAGNOSIS — R7309 Other abnormal glucose: Secondary | ICD-10-CM

## 2011-03-07 DIAGNOSIS — Z0000 Encounter for general adult medical examination without abnormal findings: Secondary | ICD-10-CM

## 2011-03-07 DIAGNOSIS — T887XXA Unspecified adverse effect of drug or medicament, initial encounter: Secondary | ICD-10-CM

## 2011-03-08 ENCOUNTER — Other Ambulatory Visit (INDEPENDENT_AMBULATORY_CARE_PROVIDER_SITE_OTHER): Payer: 59

## 2011-03-08 DIAGNOSIS — E785 Hyperlipidemia, unspecified: Secondary | ICD-10-CM

## 2011-03-08 DIAGNOSIS — T887XXA Unspecified adverse effect of drug or medicament, initial encounter: Secondary | ICD-10-CM

## 2011-03-08 DIAGNOSIS — Z0000 Encounter for general adult medical examination without abnormal findings: Secondary | ICD-10-CM

## 2011-03-08 DIAGNOSIS — R7309 Other abnormal glucose: Secondary | ICD-10-CM

## 2011-03-08 LAB — CBC WITH DIFFERENTIAL/PLATELET
Basophils Absolute: 0 10*3/uL (ref 0.0–0.1)
Eosinophils Absolute: 0.1 10*3/uL (ref 0.0–0.7)
HCT: 47.2 % (ref 39.0–52.0)
Lymphs Abs: 2.2 10*3/uL (ref 0.7–4.0)
MCHC: 34 g/dL (ref 30.0–36.0)
MCV: 95.4 fl (ref 78.0–100.0)
Monocytes Absolute: 0.3 10*3/uL (ref 0.1–1.0)
Platelets: 214 10*3/uL (ref 150.0–400.0)
RDW: 13.4 % (ref 11.5–14.6)

## 2011-03-08 LAB — POCT URINALYSIS DIPSTICK
Ketones, UA: NEGATIVE
Leukocytes, UA: NEGATIVE
Nitrite, UA: NEGATIVE
Protein, UA: NEGATIVE
pH, UA: 6

## 2011-03-08 LAB — HEPATIC FUNCTION PANEL
ALT: 22 U/L (ref 0–53)
Bilirubin, Direct: 0.2 mg/dL (ref 0.0–0.3)
Total Bilirubin: 0.9 mg/dL (ref 0.3–1.2)

## 2011-03-08 LAB — LIPID PANEL: VLDL: 26.4 mg/dL (ref 0.0–40.0)

## 2011-03-08 LAB — TSH: TSH: 3.2 u[IU]/mL (ref 0.35–5.50)

## 2011-03-08 LAB — BASIC METABOLIC PANEL
BUN: 18 mg/dL (ref 6–23)
GFR: 83.08 mL/min (ref >60.00)
Glucose, Bld: 115 mg/dL — ABNORMAL HIGH (ref 70–99)
Potassium: 4.4 mEq/L (ref 3.5–5.1)

## 2011-03-08 NOTE — Progress Notes (Signed)
Labs only

## 2011-03-14 ENCOUNTER — Encounter: Payer: Self-pay | Admitting: Internal Medicine

## 2011-03-15 ENCOUNTER — Ambulatory Visit (INDEPENDENT_AMBULATORY_CARE_PROVIDER_SITE_OTHER): Payer: 59 | Admitting: Internal Medicine

## 2011-03-15 ENCOUNTER — Encounter: Payer: Self-pay | Admitting: Internal Medicine

## 2011-03-15 VITALS — BP 130/78 | HR 71 | Temp 98.5°F | Resp 12 | Ht 76.0 in | Wt 246.2 lb

## 2011-03-15 DIAGNOSIS — Z0000 Encounter for general adult medical examination without abnormal findings: Secondary | ICD-10-CM

## 2011-03-15 DIAGNOSIS — E049 Nontoxic goiter, unspecified: Secondary | ICD-10-CM

## 2011-03-15 DIAGNOSIS — Z85828 Personal history of other malignant neoplasm of skin: Secondary | ICD-10-CM

## 2011-03-15 DIAGNOSIS — E119 Type 2 diabetes mellitus without complications: Secondary | ICD-10-CM

## 2011-03-15 DIAGNOSIS — E782 Mixed hyperlipidemia: Secondary | ICD-10-CM

## 2011-03-15 MED ORDER — VARDENAFIL HCL 20 MG PO TABS: 20.0000 mg | ORAL_TABLET | Freq: Every day | ORAL | Status: DC | PRN

## 2011-03-15 NOTE — Progress Notes (Signed)
Subjective:    Patient ID: Kenneth Shaw, male    DOB: 02-23-1956, 55 y.o.   MRN: [id number]  HPI  [name]  is here for a physical; he denies acute issues.      Review of Systems HYPERLIPIDEMIA: Chest pain, palpitations- no      Dyspnea- no (Note: on CPAP) Medications: Compliance- yes  Lightheadedness,Syncope- no    Edema- no Abd pain/ bowel changes: no Myalgias: no  DIABETES: Disease Monitoring: Blood Sugar ranges-FBS 108-130; average? 118. Two hr post meal< 180, average < 150 Polyuria/phagia/dipsia- / polyphagia      Visual problems- no Last Opth exam: DUE Medications: Compliance- yes  Hypoglycemic symptoms- no Smoking Status: quit 1984       Objective:   Physical Exam Gen.: Healthy and well-nourished in appearance. Alert, appropriate and cooperative throughout exam. Head: Normocephalic without obvious abnormalities;  no alopecia. Moustache   Eyes: No corneal or conjunctival inflammation noted. Pupils: minor asymmetry OD > OS ;reactive to light and accommodation. Fundal exam is benign without hemorrhages, exudate, papilledema. Extraocular motion intact. Vision grossly normal with lenses. Ears: External  ear exam reveals no significant lesions or deformities. Canals clear .TMs normal. Hearing is grossly normal bilaterally. Nose: External nasal exam reveals no deformity or inflammation. Nasal mucosa are pink and moist. No lesions or exudates noted. Septum  Minimally dislocated  Mouth: Oral mucosa and oropharynx reveal no lesions or exudates. Teeth in good repair. Neck: No deformities, masses, or tenderness noted. Range of motion normal. Thyroid firm with physiologic asymmetry, R > L. Lungs: Normal respiratory effort; chest expands symmetrically. Lungs are clear to auscultation without rales, wheezes, or increased work of breathing. Heart: Slow rate and regular  rhythm. Normal S1 and S2. No gallop, click, or rub. No murmur. Abdomen: Bowel sounds normal; abdomen soft and  nontender. No masses, organomegaly or hernias noted. Genitali/DRE: Varicoele on L; see PSA    .                                                                                   Musculoskeletal/extremities: No deformity or scoliosis noted of  the thoracic or lumbar spine but minimal asymmetry of paraspinus muscles. No clubbing, cyanosis, edema, or deformity noted. Range of motion  normal .Tone & strength  normal.Joints normal. Nail health  Good. Crepitus , L > R knee. Valgus changes  Vascular: Carotid, radial artery, dorsalis pedis and  posterior tibial pulses are full and equal. No bruits present. Neurologic: Alert and oriented x3. Deep tendon reflexes symmetrical and normal.  Light touch normal over feet        Skin: Intact without suspicious lesions or rashes. Lymph: No cervical, axillary, or inguinal lymphadenopathy present. Psych: Mood and affect are normal. Normally interactive  Assessment & Plan:  #1 comprehensive physical exam; no acute findings #2 see Problem List with Assessments & Recommendations  #3 EKG: Single PAC; very minor early repolarization ST-T changes; low voltage in the V. leads due to body habitus. Plan: see Orders

## 2011-03-15 NOTE — Patient Instructions (Signed)
Annual podiatry and retinal assessments are indicated because the diabetes.  Please  schedule  Labs : A1c & urine microalbumin in 4 mos (250.00). Please bring these instructions to that Lab appt.

## 2011-05-08 ENCOUNTER — Other Ambulatory Visit: Payer: Self-pay | Admitting: Internal Medicine

## 2011-07-11 ENCOUNTER — Other Ambulatory Visit: Payer: Self-pay | Admitting: Internal Medicine

## 2011-07-11 DIAGNOSIS — E119 Type 2 diabetes mellitus without complications: Secondary | ICD-10-CM

## 2011-07-12 ENCOUNTER — Other Ambulatory Visit (INDEPENDENT_AMBULATORY_CARE_PROVIDER_SITE_OTHER): Payer: 59

## 2011-07-12 DIAGNOSIS — E119 Type 2 diabetes mellitus without complications: Secondary | ICD-10-CM

## 2011-07-12 LAB — HEMOGLOBIN A1C: Hgb A1c MFr Bld: 6.2 % (ref 4.6–6.5)

## 2011-07-13 ENCOUNTER — Encounter: Payer: Self-pay | Admitting: Internal Medicine

## 2011-07-13 LAB — MICROALBUMIN / CREATININE URINE RATIO
Microalb Creat Ratio: 0.4 mg/g (ref 0.0–30.0)
Microalb, Ur: 0.8 mg/dL (ref 0.0–1.9)

## 2011-07-14 ENCOUNTER — Encounter: Payer: Self-pay | Admitting: Internal Medicine

## 2011-07-19 ENCOUNTER — Ambulatory Visit: Payer: 59 | Admitting: Internal Medicine

## 2011-07-25 ENCOUNTER — Ambulatory Visit (INDEPENDENT_AMBULATORY_CARE_PROVIDER_SITE_OTHER): Payer: 59 | Admitting: Internal Medicine

## 2011-07-25 ENCOUNTER — Encounter: Payer: Self-pay | Admitting: Internal Medicine

## 2011-07-25 DIAGNOSIS — E119 Type 2 diabetes mellitus without complications: Secondary | ICD-10-CM

## 2011-07-25 DIAGNOSIS — E785 Hyperlipidemia, unspecified: Secondary | ICD-10-CM

## 2011-07-25 MED ORDER — METFORMIN HCL 500 MG PO TABS: 500.0000 mg | ORAL_TABLET | Freq: Every day | ORAL | Status: DC

## 2011-07-25 MED ORDER — PRAVASTATIN SODIUM 40 MG PO TABS: 40.0000 mg | ORAL_TABLET | Freq: Every day | ORAL | Status: DC

## 2011-07-25 NOTE — Assessment & Plan Note (Signed)
Diabetes control is excellent with no adverse effects. No change will be made in his metformin. He should followup in October.

## 2011-07-25 NOTE — Progress Notes (Signed)
Subjective:    Patient ID: Kenneth Shaw, male    DOB: 01-31-1956, 56 y.o.   MRN: [id number]  HPI Diabetes status assessment: Fasting or morning glucose range: not cecked  Highest glucose 2 hours after any meal:  Not checked. Hypoglycemia :  no .                                                     Excess thirst ; hunger; urination:  no.                                  Lightheadedness with standing:  no. Chest pain ; Palpitations ;  Pain in  calves with walking: no.                                                                                                                                Non healing skin  ulcers or sores,especially over the feet:  no. Numbness or tingling or burning in feet :no .                                                                                                                                              Significant change in  Weight : down 13 #. Vision changes : no  .                                                                    Exercise : CVE 5 X/ week . Nutrition/diet:  Low carb. Medication compliance : yes. Medication adverse  Effects:  no . Eye exam : last 10/12 ; no retinopathy. Foot care : no.  A1c/ urine microalbumin monitor:  A1c was 6.2% 07/12/11. His high was 6.7% 03/08/11. Urine microalbumin has dropped from 3.8-0.8       Review of Systems     Objective:  Physical Exam He appears healthy and well-nourished; he is in no acute distress  No carotid bruits are present.  Heart rhythm and rate are normal with no significant murmurs or gallops.S 4  Chest is clear with no increased work of breathing  There is no evidence of aortic aneurysm or renal artery bruits  He has no clubbing or edema.   Pedal pulses are intact   No ischemic skin changes are present ; hair growth is good over the feet  Light touch is normal over the feet        Assessment & Plan:

## 2011-07-25 NOTE — Patient Instructions (Signed)
A1c GOALS  Non diabetic adults: 5 %-6.1%  Good diabetic control: 6.2-6.4 %  Fair diabetic control: 6.5-7%  Poor diabetic control: greater than 7 % ( except with additional factors such as  advanced age; significant coronary or neurologic disease,etc). Check the A1c every 6 months if it is < 6.5%; every 4 months if  6.5% or higher.  Goals for home glucose monitoring are : fasting  or morning glucose goal of  90-150. Check this Monday, Wednesday, Friday, and Sunday.  Goals for sugars 2 hours after any meal (2 hours from the "first bite")  = < 180, preferably < 160. Check 2 hours after breakfast on Tuesday; 2 hours after lunch on Thursday; and 2 hours after the evening meal  on Saturday. Report any low blood glucoses immediately.

## 2011-12-15 ENCOUNTER — Encounter: Payer: Self-pay | Admitting: Internal Medicine

## 2011-12-16 ENCOUNTER — Encounter: Payer: Self-pay | Admitting: Internal Medicine

## 2011-12-22 ENCOUNTER — Other Ambulatory Visit: Payer: Self-pay | Admitting: Internal Medicine

## 2011-12-22 DIAGNOSIS — Z0000 Encounter for general adult medical examination without abnormal findings: Secondary | ICD-10-CM

## 2011-12-22 DIAGNOSIS — N529 Male erectile dysfunction, unspecified: Secondary | ICD-10-CM

## 2011-12-22 DIAGNOSIS — E119 Type 2 diabetes mellitus without complications: Secondary | ICD-10-CM

## 2011-12-22 DIAGNOSIS — E785 Hyperlipidemia, unspecified: Secondary | ICD-10-CM

## 2011-12-22 DIAGNOSIS — T887XXA Unspecified adverse effect of drug or medicament, initial encounter: Secondary | ICD-10-CM

## 2012-01-22 ENCOUNTER — Encounter: Payer: Self-pay | Admitting: Internal Medicine

## 2012-01-23 NOTE — Telephone Encounter (Signed)
Dr.Hopper please advise 

## 2012-01-30 ENCOUNTER — Other Ambulatory Visit: Payer: Self-pay | Admitting: Internal Medicine

## 2012-03-13 ENCOUNTER — Other Ambulatory Visit: Payer: 59

## 2012-03-14 ENCOUNTER — Other Ambulatory Visit (INDEPENDENT_AMBULATORY_CARE_PROVIDER_SITE_OTHER): Payer: No Typology Code available for payment source

## 2012-03-14 DIAGNOSIS — Z0000 Encounter for general adult medical examination without abnormal findings: Secondary | ICD-10-CM

## 2012-03-14 DIAGNOSIS — T887XXA Unspecified adverse effect of drug or medicament, initial encounter: Secondary | ICD-10-CM

## 2012-03-14 DIAGNOSIS — E785 Hyperlipidemia, unspecified: Secondary | ICD-10-CM

## 2012-03-14 DIAGNOSIS — N529 Male erectile dysfunction, unspecified: Secondary | ICD-10-CM

## 2012-03-14 DIAGNOSIS — E119 Type 2 diabetes mellitus without complications: Secondary | ICD-10-CM

## 2012-03-14 LAB — CBC WITH DIFFERENTIAL/PLATELET
Basophils Absolute: 0 10*3/uL (ref 0.0–0.1)
Eosinophils Absolute: 0.1 10*3/uL (ref 0.0–0.7)
Eosinophils Relative: 2.8 % (ref 0.0–5.0)
MCV: 96.3 fl (ref 78.0–100.0)
Monocytes Absolute: 0.4 10*3/uL (ref 0.1–1.0)
Neutrophils Relative %: 63 % (ref 43.0–77.0)
Platelets: 182 10*3/uL (ref 150.0–400.0)
WBC: 5.2 10*3/uL (ref 4.5–10.5)

## 2012-03-14 LAB — HEPATIC FUNCTION PANEL
ALT: 17 U/L (ref 0–53)
Alkaline Phosphatase: 45 U/L (ref 39–117)
Bilirubin, Direct: 0.1 mg/dL (ref 0.0–0.3)
Total Protein: 6.9 g/dL (ref 6.0–8.3)

## 2012-03-14 LAB — LIPID PANEL
Cholesterol: 150 mg/dL (ref 0–200)
LDL Cholesterol: 91 mg/dL (ref 0–99)

## 2012-03-14 LAB — BASIC METABOLIC PANEL
BUN: 24 mg/dL — ABNORMAL HIGH (ref 6–23)
Chloride: 109 mEq/L (ref 96–112)
Creatinine, Ser: 1 mg/dL (ref 0.4–1.5)

## 2012-03-14 LAB — TESTOSTERONE: Testosterone: 382.11 ng/dL (ref 350.00–890.00)

## 2012-03-14 LAB — TSH: TSH: 3.88 u[IU]/mL (ref 0.35–5.50)

## 2012-03-14 LAB — MICROALBUMIN / CREATININE URINE RATIO
Creatinine,U: 181.8 mg/dL
Microalb Creat Ratio: 0.3 mg/g (ref 0.0–30.0)

## 2012-03-20 ENCOUNTER — Ambulatory Visit (INDEPENDENT_AMBULATORY_CARE_PROVIDER_SITE_OTHER): Payer: No Typology Code available for payment source | Admitting: Internal Medicine

## 2012-03-20 ENCOUNTER — Encounter: Payer: Self-pay | Admitting: Internal Medicine

## 2012-03-20 VITALS — BP 116/70 | HR 63 | Temp 98.5°F | Ht 76.0 in | Wt 234.4 lb

## 2012-03-20 DIAGNOSIS — E042 Nontoxic multinodular goiter: Secondary | ICD-10-CM

## 2012-03-20 DIAGNOSIS — M1710 Unilateral primary osteoarthritis, unspecified knee: Secondary | ICD-10-CM

## 2012-03-20 DIAGNOSIS — IMO0002 Reserved for concepts with insufficient information to code with codable children: Secondary | ICD-10-CM

## 2012-03-20 DIAGNOSIS — M179 Osteoarthritis of knee, unspecified: Secondary | ICD-10-CM | POA: Insufficient documentation

## 2012-03-20 DIAGNOSIS — E119 Type 2 diabetes mellitus without complications: Secondary | ICD-10-CM

## 2012-03-20 DIAGNOSIS — E785 Hyperlipidemia, unspecified: Secondary | ICD-10-CM

## 2012-03-20 DIAGNOSIS — Z0000 Encounter for general adult medical examination without abnormal findings: Secondary | ICD-10-CM

## 2012-03-20 MED ORDER — PRAVASTATIN SODIUM 40 MG PO TABS: 40.0000 mg | ORAL_TABLET | Freq: Every day | ORAL | Status: DC

## 2012-03-20 MED ORDER — VARDENAFIL HCL 20 MG PO TABS: 20.0000 mg | ORAL_TABLET | Freq: Every day | ORAL | Status: DC | PRN

## 2012-03-20 NOTE — Patient Instructions (Addendum)
Preventive Health Care: Eye Doctor - have an eye exam @ least annually. Check A1c in 6 months.                                                         Health Care Power of Attorney & Living Will. Complete if not in place ; these place you in charge of your health care decisions. Please see preop clearance note and labs. Patient is medically cleared for surgery.   Review and correct the record as indicated. Please share record with all medical staff seen.

## 2012-03-20 NOTE — Progress Notes (Signed)
Subjective:    Patient ID: Kenneth Shaw, male    DOB: [birth year], 56 y.o.   MRN: [id number]  HPI  Kenneth Shaw is here for a physical;acute issues include constant pain due to end stage DJD of L knee. He has had 2 prior surgeries on the left knee; he is unable to be physically active because of the pain in in the knee. Total knee replacement is scheduled 06/20/12 by Dr.Alusio.      Review of Systems The most recent A1c 03/14/12   Was 5.6%   , which correlates to an average sugar of 114 , and long-term risk of 12 %  . Fasting blood sugar ranges: 90s-135   .  Highest two-hour postprandial glucose: < 175, usually < 130  . No hypoglycemia Last ophthalmologic examination 4/13  revealed no retinopathy. No active podiatry assessment on record. Diet is low carb . Exercise mainly upper body due to knee . Weight loss 8# with TLC.  The most recent lipids 10/9   reveal LDL 91 , HDL48.3  , and triglycerides  53 . There is medical compliance with the statin.  Constitutional: No fever, chills,  fatigue, weakness or night sweats Eyes: No  blurred vision, double vision, or loss of vision Cardiovascular: no chest pain, palpitations, racing, irregular rhythm,syncope,nausea,sweating, claudication, or edema  Respiratory: No exertinal dyspnea, paroxysmal nocturnal dyspnea Gastrointestinal: No heartburn,dysphagia, diarrhea, significant constipation, rectal bleeding, melena Genitourinary: No dysuria,hematuria, pyuria, frequency,  incontinence, nocturia. S/P prostatitis Summer 2013; resolved with 14 days Cipro Musculoskeletal: No myalgias or muscle cramping ,significant weakness Dermatologic: No rash, pruritus, urticaria, or change in color or temperature of skin Neurologic: No  limb weakness,  numbness or tingling Endocrine: No change in hair/skin/ nails, excessive thirst, excessive hunger, excessive urination, or unexplained fatigue. Decreased libido & ED. (See Testosterone level)          Objective:   Physical Exam Gen.: Healthy and well-nourished in appearance. Alert, appropriate and cooperative throughout exam. Head: Normocephalic without obvious abnormalities;  moustache  Eyes: No corneal or conjunctival inflammation noted. Pupils equal round reactive to light and accommodation. Fundal exam is benign without hemorrhages, exudate, papilledema. Extraocular motion intact. Vision grossly normal. Ears: External  ear exam reveals no significant lesions or deformities. Canals clear .TMs normal. Hearing is grossly normal bilaterally. Nose: External nasal exam reveals no deformity or inflammation. Nasal mucosa are pink and moist. No lesions or exudates noted.  Mouth: Oral mucosa and oropharynx reveal no lesions or exudates. Teeth in good repair. Neck: No deformities, masses, or tenderness noted. Range of motion & Thyroid : L lobe > R w/o nodules. Lungs: Normal respiratory effort; chest expands symmetrically. Lungs are clear to auscultation without rales, wheezes, or increased work of breathing. Heart: Normal rate and rhythm. Normal S1 and S2. No gallop, click, or rub. No murmur. Abdomen: Bowel sounds normal; abdomen soft and nontender. No masses, organomegaly or hernias noted. Genitalia/ DRE: Genitalia normal except for left varices. Prostate is normal without enlargement, asymmetry, nodularity, or induration.                                                                  Musculoskeletal/extremities: No deformity or scoliosis noted of  the thoracic or lumbar spine. No clubbing, cyanosis, edema, or  deformity noted. Range of motion decreased L knee. Crepitus of L knee .Tone & strength . Pes planus. Ganglion R ventral foot normal.Joints normal. Nail health  Good but fingernails closely trimmed. Vascular: Carotid, radial artery, dorsalis pedis and  posterior tibial pulses are full and equal. No bruits present. Neurologic: Alert and oriented x3. Deep tendon reflexes symmetrical and normal.          Skin:  Intact without suspicious lesions or rashes.Benign keratosis R lower abdomen Lymph: No cervical, axillary, or inguinal lymphadenopathy present. Psych: Mood and affect are normal. Normally interactive                                                                                         Assessment & Plan:  #1 comprehensive physical exam; no acute findings #2 end-stage degenerative disease left knee; no contraindication to total knee replacement unless a change in health status prior to surgery 06/20/12 #3 past history of diabetes; A1c is now in the nondiabetic range #4 dyslipidemia, lipids are at goal #5 decreased libido & erectile dysfunction with low normal testosterone level. If he wants to pursue testosterone replacement; urology or endocrinology referral will be made #6 multinodular goiter; status post fine-needle aspiration 10/11. Followup ultrasound indicated Plan: see Orders

## 2012-03-22 ENCOUNTER — Encounter: Payer: Self-pay | Admitting: Internal Medicine

## 2012-03-23 ENCOUNTER — Ambulatory Visit (HOSPITAL_BASED_OUTPATIENT_CLINIC_OR_DEPARTMENT_OTHER)
Admission: RE | Admit: 2012-03-23 | Discharge: 2012-03-23 | Disposition: A | Discharge status: Home / Self Care | Payer: No Typology Code available for payment source | Source: Ambulatory Visit | Attending: Internal Medicine | Admitting: Internal Medicine

## 2012-03-23 DIAGNOSIS — E042 Nontoxic multinodular goiter: Secondary | ICD-10-CM | POA: Insufficient documentation

## 2012-03-28 ENCOUNTER — Encounter: Payer: Self-pay | Admitting: *Deleted

## 2012-03-29 ENCOUNTER — Other Ambulatory Visit: Payer: Self-pay | Admitting: Orthopedic Surgery

## 2012-03-29 MED ORDER — BUPIVACAINE 0.25 % ON-Q PUMP SINGLE CATH 300ML: 300.0000 mL | INJECTION | Status: DC

## 2012-03-29 MED ORDER — DEXAMETHASONE SODIUM PHOSPHATE 10 MG/ML IJ SOLN: 10.0000 mg | Freq: Once | INTRAMUSCULAR | Status: DC

## 2012-03-29 NOTE — Progress Notes (Signed)
Preoperative surgical orders have been place into the Epic hospital system for LIN GLAZIER on 03/29/2012, 1:23 PM  by Patrica Duel for surgery on 04/19/12.  Preop Total Knee orders including Bupivacaine On-Q pump, IV Tylenol, and IV Decadron as long as there are no contraindications to the above medications. Avel Peace, PA-C

## 2012-04-09 ENCOUNTER — Encounter (HOSPITAL_COMMUNITY): Payer: Self-pay | Admitting: Pharmacy Technician

## 2012-04-12 ENCOUNTER — Encounter (HOSPITAL_COMMUNITY): Payer: Self-pay

## 2012-04-12 ENCOUNTER — Other Ambulatory Visit (HOSPITAL_COMMUNITY): Payer: Self-pay | Admitting: Orthopedic Surgery

## 2012-04-12 NOTE — Patient Instructions (Addendum)
20 Kenneth Shaw  04/12/2012   Your procedure is scheduled on:  04-19-2012  Report to [facility] at  1015 AM.  Call this number if you have problems the morning of surgery: [phone]   Remember:BRING BIPAP MASK AND TUBING ONLY.   Do not eat food or drink liquids:After Midnight.  .  Take these medicines the morning of surgery with A SIP OF WATER:  NO MEDS TO TAKE   Do not wear jewelry or make up.  Do not wear lotions, powders, or perfumes. You may wear deodorant.    Do not bring valuables to the hospital.  Contacts, dentures or bridgework may not be worn into surgery.  Leave suitcase in the car. After surgery it may be brought to your room.  For patients admitted to the hospital, checkout time is 11:00 AM the day of discharge                             Patients discharged the day of surgery will not be allowed to drive home. If going home same day of surgery, you must have someone stay with you the first 24 hours at home and arrange for some one to drive you home from hospital.    Special Instructions: See [facility] Preparing for Surgery instruction sheet. Women do not shave legs or underarms for 12 hours before showers. Men may shave face morning of surgery.    Please read over the following fact sheets that you were given: MRSA Information, blood fact sheet, incentive spirometer fact sheet [facility] pre op nurse phone number [phone], call if needed

## 2012-04-13 ENCOUNTER — Encounter (HOSPITAL_COMMUNITY): Payer: Self-pay

## 2012-04-13 ENCOUNTER — Other Ambulatory Visit (HOSPITAL_COMMUNITY): Payer: Self-pay | Admitting: Orthopedic Surgery

## 2012-04-13 ENCOUNTER — Encounter (HOSPITAL_COMMUNITY)
Admission: RE | Admit: 2012-04-13 | Discharge: 2012-04-13 | Disposition: A | Discharge status: Home / Self Care | Payer: No Typology Code available for payment source | Source: Ambulatory Visit | Attending: Orthopedic Surgery | Admitting: Orthopedic Surgery

## 2012-04-13 HISTORY — DX: Sleep apnea, unspecified: G47.30

## 2012-04-13 HISTORY — DX: Unspecified osteoarthritis, unspecified site: M19.90

## 2012-04-13 HISTORY — DX: Nontoxic goiter, unspecified: E04.9

## 2012-04-13 LAB — URINALYSIS, ROUTINE W REFLEX MICROSCOPIC
Hgb urine dipstick: NEGATIVE
Nitrite: NEGATIVE
Specific Gravity, Urine: 1.022 (ref 1.005–1.030)
Urobilinogen, UA: 0.2 mg/dL (ref 0.0–1.0)
pH: 5.5 (ref 5.0–8.0)

## 2012-04-13 LAB — COMPREHENSIVE METABOLIC PANEL
AST: 18 U/L (ref 0–37)
Albumin: 4.2 g/dL (ref 3.5–5.2)
Alkaline Phosphatase: 53 U/L (ref 39–117)
BUN: 21 mg/dL (ref 6–23)
Chloride: 102 mEq/L (ref 96–112)
Potassium: 3.6 mEq/L (ref 3.5–5.1)
Total Bilirubin: 0.4 mg/dL (ref 0.3–1.2)
Total Protein: 6.8 g/dL (ref 6.0–8.3)

## 2012-04-13 LAB — CBC
HCT: 43.6 % (ref 39.0–52.0)
MCHC: 35.6 g/dL (ref 30.0–36.0)
Platelets: 207 10*3/uL (ref 150–400)
RDW: 13 % (ref 11.5–15.5)
WBC: 7.1 10*3/uL (ref 4.0–10.5)

## 2012-04-13 LAB — PROTIME-INR
INR: 0.92 (ref 0.00–1.49)
Prothrombin Time: 12.3 seconds (ref 11.6–15.2)

## 2012-04-13 LAB — SURGICAL PCR SCREEN
MRSA, PCR: NEGATIVE
Staphylococcus aureus: NEGATIVE

## 2012-04-13 LAB — APTT: aPTT: 26 seconds (ref 24–37)

## 2012-04-13 NOTE — Progress Notes (Signed)
03-20-2012 ekg epic

## 2012-04-18 ENCOUNTER — Other Ambulatory Visit: Payer: Self-pay | Admitting: Orthopedic Surgery

## 2012-04-19 ENCOUNTER — Encounter (HOSPITAL_COMMUNITY): Payer: Self-pay | Admitting: *Deleted

## 2012-04-19 ENCOUNTER — Encounter (HOSPITAL_COMMUNITY): Payer: Self-pay | Admitting: Anesthesiology

## 2012-04-19 ENCOUNTER — Encounter (HOSPITAL_COMMUNITY)
Admission: RE | Disposition: A | Discharge status: Home Health | Payer: Self-pay | Source: Ambulatory Visit | Attending: Orthopedic Surgery

## 2012-04-19 ENCOUNTER — Ambulatory Visit (HOSPITAL_COMMUNITY): Payer: No Typology Code available for payment source | Admitting: Anesthesiology

## 2012-04-19 ENCOUNTER — Other Ambulatory Visit: Payer: Self-pay | Admitting: Orthopedic Surgery

## 2012-04-19 ENCOUNTER — Inpatient Hospital Stay (HOSPITAL_COMMUNITY)
Admission: RE | Admit: 2012-04-19 | Discharge: 2012-04-21 | DRG: 470 | Disposition: A | Discharge status: Home Health | Payer: No Typology Code available for payment source | Source: Ambulatory Visit | Attending: Orthopedic Surgery | Admitting: Orthopedic Surgery

## 2012-04-19 DIAGNOSIS — E119 Type 2 diabetes mellitus without complications: Secondary | ICD-10-CM | POA: Diagnosis present

## 2012-04-19 DIAGNOSIS — M179 Osteoarthritis of knee, unspecified: Secondary | ICD-10-CM | POA: Diagnosis present

## 2012-04-19 DIAGNOSIS — E871 Hypo-osmolality and hyponatremia: Secondary | ICD-10-CM

## 2012-04-19 DIAGNOSIS — G4730 Sleep apnea, unspecified: Secondary | ICD-10-CM | POA: Diagnosis present

## 2012-04-19 DIAGNOSIS — Z01812 Encounter for preprocedural laboratory examination: Secondary | ICD-10-CM

## 2012-04-19 DIAGNOSIS — M1710 Unilateral primary osteoarthritis, unspecified knee: Principal | ICD-10-CM | POA: Diagnosis present

## 2012-04-19 DIAGNOSIS — D62 Acute posthemorrhagic anemia: Secondary | ICD-10-CM

## 2012-04-19 HISTORY — PX: TOTAL KNEE ARTHROPLASTY: SHX125

## 2012-04-19 LAB — GLUCOSE, CAPILLARY: Glucose-Capillary: 102 mg/dL — ABNORMAL HIGH (ref 70–99)

## 2012-04-19 LAB — ABO/RH: ABO/RH(D): O NEG

## 2012-04-19 LAB — TYPE AND SCREEN: ABO/RH(D): O NEG

## 2012-04-19 SURGERY — ARTHROPLASTY, KNEE, TOTAL
Anesthesia: Spinal | Site: Knee | Laterality: Left | Wound class: Clean

## 2012-04-19 MED ORDER — TEMAZEPAM 15 MG PO CAPS: 15.0000 mg | ORAL_CAPSULE | Freq: Every evening | ORAL | Status: DC | PRN

## 2012-04-19 MED ORDER — FENTANYL CITRATE 0.05 MG/ML IJ SOLN
INTRAMUSCULAR | Status: DC | PRN
  Administered 2012-04-19: 50 ug via INTRAVENOUS

## 2012-04-19 MED ORDER — MENTHOL 3 MG MT LOZG
1.0000 | LOZENGE | OROMUCOSAL | Status: DC | PRN
  Filled 2012-04-19: qty 9

## 2012-04-19 MED ORDER — DOCUSATE SODIUM 100 MG PO CAPS
100.0000 mg | ORAL_CAPSULE | Freq: Two times a day (BID) | ORAL | Status: DC
  Administered 2012-04-19 – 2012-04-21 (×4): 100 mg via ORAL

## 2012-04-19 MED ORDER — CEFAZOLIN SODIUM 1-5 GM-% IV SOLN
1.0000 g | Freq: Four times a day (QID) | INTRAVENOUS | Status: AC
  Administered 2012-04-19 – 2012-04-20 (×2): 1 g via INTRAVENOUS
  Filled 2012-04-19 (×2): qty 50

## 2012-04-19 MED ORDER — TRAMADOL HCL 50 MG PO TABS: 50.0000 mg | ORAL_TABLET | Freq: Four times a day (QID) | ORAL | Status: DC | PRN

## 2012-04-19 MED ORDER — ACETAMINOPHEN 650 MG RE SUPP: 650.0000 mg | Freq: Four times a day (QID) | RECTAL | Status: DC | PRN

## 2012-04-19 MED ORDER — METHOCARBAMOL 100 MG/ML IJ SOLN
500.0000 mg | Freq: Four times a day (QID) | INTRAMUSCULAR | Status: DC | PRN
  Administered 2012-04-19: 500 mg via INTRAVENOUS
  Filled 2012-04-19 (×2): qty 5

## 2012-04-19 MED ORDER — SIMVASTATIN 20 MG PO TABS
20.0000 mg | ORAL_TABLET | Freq: Every day | ORAL | Status: DC
  Administered 2012-04-19 – 2012-04-20 (×2): 20 mg via ORAL
  Filled 2012-04-19 (×3): qty 1

## 2012-04-19 MED ORDER — PROPOFOL INFUSION 10 MG/ML OPTIME
INTRAVENOUS | Status: DC | PRN
  Administered 2012-04-19: 300 ug/kg/min via INTRAVENOUS

## 2012-04-19 MED ORDER — SODIUM CHLORIDE 0.9 % IR SOLN
Status: DC | PRN
  Administered 2012-04-19: 1000 mL

## 2012-04-19 MED ORDER — HYDROMORPHONE HCL PF 1 MG/ML IJ SOLN
0.2500 mg | INTRAMUSCULAR | Status: DC | PRN
  Administered 2012-04-19: 0.5 mg via INTRAVENOUS

## 2012-04-19 MED ORDER — ONDANSETRON HCL 4 MG PO TABS: 4.0000 mg | ORAL_TABLET | Freq: Four times a day (QID) | ORAL | Status: DC | PRN

## 2012-04-19 MED ORDER — ACETAMINOPHEN 10 MG/ML IV SOLN
1000.0000 mg | Freq: Once | INTRAVENOUS | Status: AC
  Administered 2012-04-19: 1000 mg via INTRAVENOUS

## 2012-04-19 MED ORDER — MIDAZOLAM HCL 5 MG/5ML IJ SOLN
INTRAMUSCULAR | Status: DC | PRN
  Administered 2012-04-19: 2 mg via INTRAVENOUS

## 2012-04-19 MED ORDER — PHENOL 1.4 % MT LIQD
1.0000 | OROMUCOSAL | Status: DC | PRN
  Filled 2012-04-19: qty 177

## 2012-04-19 MED ORDER — METOCLOPRAMIDE HCL 5 MG/ML IJ SOLN: 5.0000 mg | Freq: Three times a day (TID) | INTRAMUSCULAR | Status: DC | PRN

## 2012-04-19 MED ORDER — PROMETHAZINE HCL 25 MG/ML IJ SOLN: 6.2500 mg | INTRAMUSCULAR | Status: DC | PRN

## 2012-04-19 MED ORDER — LACTATED RINGERS IV SOLN: INTRAVENOUS | Status: DC

## 2012-04-19 MED ORDER — BUPIVACAINE IN DEXTROSE 0.75-8.25 % IT SOLN
INTRATHECAL | Status: DC | PRN
  Administered 2012-04-19: 2 mL via INTRATHECAL

## 2012-04-19 MED ORDER — BUPIVACAINE-EPINEPHRINE 0.25% -1:200000 IJ SOLN
INTRAMUSCULAR | Status: AC
  Filled 2012-04-19: qty 1

## 2012-04-19 MED ORDER — DEXAMETHASONE SODIUM PHOSPHATE 10 MG/ML IJ SOLN
10.0000 mg | Freq: Once | INTRAMUSCULAR | Status: AC
  Filled 2012-04-19: qty 1

## 2012-04-19 MED ORDER — BUPIVACAINE 0.25 % ON-Q PUMP SINGLE CATH 300ML
INJECTION | Status: DC | PRN
  Administered 2012-04-19: 300 mL

## 2012-04-19 MED ORDER — POLYETHYLENE GLYCOL 3350 17 G PO PACK: 17.0000 g | PACK | Freq: Every day | ORAL | Status: DC | PRN

## 2012-04-19 MED ORDER — BISACODYL 10 MG RE SUPP: 10.0000 mg | Freq: Every day | RECTAL | Status: DC | PRN

## 2012-04-19 MED ORDER — DIPHENHYDRAMINE HCL 12.5 MG/5ML PO ELIX: 12.5000 mg | ORAL_SOLUTION | ORAL | Status: DC | PRN

## 2012-04-19 MED ORDER — SODIUM CHLORIDE 0.9 % IV SOLN: INTRAVENOUS | Status: DC

## 2012-04-19 MED ORDER — BUPIVACAINE 0.25 % ON-Q PUMP SINGLE CATH 300ML
INJECTION | Status: AC
  Filled 2012-04-19: qty 300

## 2012-04-19 MED ORDER — RIVAROXABAN 10 MG PO TABS
10.0000 mg | ORAL_TABLET | Freq: Every day | ORAL | Status: DC
  Administered 2012-04-20 – 2012-04-21 (×2): 10 mg via ORAL
  Filled 2012-04-19 (×3): qty 1

## 2012-04-19 MED ORDER — BUPIVACAINE ON-Q PAIN PUMP (FOR ORDER SET NO CHG)
INJECTION | Status: DC
  Filled 2012-04-19: qty 1

## 2012-04-19 MED ORDER — METHOCARBAMOL 500 MG PO TABS
500.0000 mg | ORAL_TABLET | Freq: Four times a day (QID) | ORAL | Status: DC | PRN
  Administered 2012-04-20 – 2012-04-21 (×4): 500 mg via ORAL
  Filled 2012-04-19 (×4): qty 1

## 2012-04-19 MED ORDER — PROPOFOL 10 MG/ML IV BOLUS
INTRAVENOUS | Status: DC | PRN
  Administered 2012-04-19: 30 mg via INTRAVENOUS

## 2012-04-19 MED ORDER — ACETAMINOPHEN 10 MG/ML IV SOLN
INTRAVENOUS | Status: AC
  Filled 2012-04-19: qty 100

## 2012-04-19 MED ORDER — FLEET ENEMA 7-19 GM/118ML RE ENEM: 1.0000 | ENEMA | Freq: Once | RECTAL | Status: AC | PRN

## 2012-04-19 MED ORDER — ONDANSETRON HCL 4 MG/2ML IJ SOLN: 4.0000 mg | Freq: Four times a day (QID) | INTRAMUSCULAR | Status: DC | PRN

## 2012-04-19 MED ORDER — 0.9 % SODIUM CHLORIDE (POUR BTL) OPTIME
TOPICAL | Status: DC | PRN
  Administered 2012-04-19: 1000 mL

## 2012-04-19 MED ORDER — KETOROLAC TROMETHAMINE 30 MG/ML IJ SOLN
INTRAMUSCULAR | Status: AC
  Filled 2012-04-19: qty 1

## 2012-04-19 MED ORDER — LACTATED RINGERS IV SOLN
INTRAVENOUS | Status: DC | PRN
  Administered 2012-04-19 (×4): via INTRAVENOUS

## 2012-04-19 MED ORDER — POTASSIUM CHLORIDE IN NACL 20-0.9 MEQ/L-% IV SOLN
INTRAVENOUS | Status: DC
  Administered 2012-04-19 – 2012-04-20 (×2): via INTRAVENOUS
  Filled 2012-04-19 (×3): qty 1000

## 2012-04-19 MED ORDER — CEFAZOLIN SODIUM-DEXTROSE 2-3 GM-% IV SOLR
2.0000 g | INTRAVENOUS | Status: AC
  Administered 2012-04-19: 2 g via INTRAVENOUS

## 2012-04-19 MED ORDER — ACETAMINOPHEN 10 MG/ML IV SOLN
1000.0000 mg | Freq: Four times a day (QID) | INTRAVENOUS | Status: AC
  Administered 2012-04-19 – 2012-04-20 (×4): 1000 mg via INTRAVENOUS
  Filled 2012-04-19 (×6): qty 100

## 2012-04-19 MED ORDER — HYDROMORPHONE HCL PF 1 MG/ML IJ SOLN
INTRAMUSCULAR | Status: AC
  Filled 2012-04-19: qty 1

## 2012-04-19 MED ORDER — ACETAMINOPHEN 325 MG PO TABS: 650.0000 mg | ORAL_TABLET | Freq: Four times a day (QID) | ORAL | Status: DC | PRN

## 2012-04-19 MED ORDER — MORPHINE SULFATE 2 MG/ML IJ SOLN
1.0000 mg | INTRAMUSCULAR | Status: DC | PRN
  Administered 2012-04-19: 1 mg via INTRAVENOUS
  Filled 2012-04-19: qty 1

## 2012-04-19 MED ORDER — CEFAZOLIN SODIUM-DEXTROSE 2-3 GM-% IV SOLR
INTRAVENOUS | Status: AC
  Filled 2012-04-19: qty 50

## 2012-04-19 MED ORDER — DEXAMETHASONE 6 MG PO TABS
10.0000 mg | ORAL_TABLET | Freq: Once | ORAL | Status: AC
  Administered 2012-04-20: 10 mg via ORAL
  Filled 2012-04-19: qty 1

## 2012-04-19 MED ORDER — OXYCODONE HCL 5 MG PO TABS
5.0000 mg | ORAL_TABLET | ORAL | Status: DC | PRN
  Administered 2012-04-19: 10 mg via ORAL
  Administered 2012-04-19: 15 mg via ORAL
  Administered 2012-04-20: 10 mg via ORAL
  Administered 2012-04-20 (×5): 15 mg via ORAL
  Administered 2012-04-20: 5 mg via ORAL
  Administered 2012-04-21 (×2): 15 mg via ORAL
  Filled 2012-04-19 (×4): qty 3
  Filled 2012-04-19: qty 2
  Filled 2012-04-19 (×2): qty 3
  Filled 2012-04-19: qty 1
  Filled 2012-04-19 (×2): qty 3
  Filled 2012-04-19: qty 2

## 2012-04-19 MED ORDER — METOCLOPRAMIDE HCL 10 MG PO TABS: 5.0000 mg | ORAL_TABLET | Freq: Three times a day (TID) | ORAL | Status: DC | PRN

## 2012-04-19 SURGICAL SUPPLY — 51 items
BAG ZIPLOCK 12X15 (MISCELLANEOUS) ×2 IMPLANT
BANDAGE ELASTIC 6 VELCRO ST LF (GAUZE/BANDAGES/DRESSINGS) ×2 IMPLANT
BANDAGE ESMARK 6X9 LF (GAUZE/BANDAGES/DRESSINGS) ×1 IMPLANT
BLADE SAG 18X100X1.27 (BLADE) ×2 IMPLANT
BLADE SAW SGTL 11.0X1.19X90.0M (BLADE) ×2 IMPLANT
BNDG ESMARK 6X9 LF (GAUZE/BANDAGES/DRESSINGS) ×2
BOWL SMART MIX CTS (DISPOSABLE) ×2 IMPLANT
CATH KIT ON-Q SILVERSOAK 5IN (CATHETERS) ×2 IMPLANT
CEMENT HV SMART SET (Cement) ×4 IMPLANT
CLOTH BEACON ORANGE TIMEOUT ST (SAFETY) ×2 IMPLANT
CUFF TOURN SGL QUICK 34 (TOURNIQUET CUFF) ×1
CUFF TRNQT CYL 34X4X40X1 (TOURNIQUET CUFF) ×1 IMPLANT
DRAPE EXTREMITY T 121X128X90 (DRAPE) ×2 IMPLANT
DRAPE POUCH INSTRU U-SHP 10X18 (DRAPES) ×2 IMPLANT
DRAPE U-SHAPE 47X51 STRL (DRAPES) ×2 IMPLANT
DRSG ADAPTIC 3X8 NADH LF (GAUZE/BANDAGES/DRESSINGS) ×2 IMPLANT
DRSG PAD ABDOMINAL 8X10 ST (GAUZE/BANDAGES/DRESSINGS) ×2 IMPLANT
DURAPREP 26ML APPLICATOR (WOUND CARE) ×2 IMPLANT
ELECT REM PT RETURN 9FT ADLT (ELECTROSURGICAL) ×2
ELECTRODE REM PT RTRN 9FT ADLT (ELECTROSURGICAL) ×1 IMPLANT
EVACUATOR 1/8 PVC DRAIN (DRAIN) ×2 IMPLANT
FACESHIELD LNG OPTICON STERILE (SAFETY) ×10 IMPLANT
GLOVE BIO SURGEON STRL SZ8 (GLOVE) ×2 IMPLANT
GLOVE BIOGEL PI IND STRL 8 (GLOVE) ×2 IMPLANT
GLOVE BIOGEL PI INDICATOR 8 (GLOVE) ×2
GLOVE ECLIPSE 8.0 STRL XLNG CF (GLOVE) ×2 IMPLANT
GLOVE SURG SS PI 6.5 STRL IVOR (GLOVE) ×4 IMPLANT
GOWN STRL NON-REIN LRG LVL3 (GOWN DISPOSABLE) ×4 IMPLANT
GOWN STRL REIN XL XLG (GOWN DISPOSABLE) ×2 IMPLANT
HANDPIECE INTERPULSE COAX TIP (DISPOSABLE) ×1
IMMOBILIZER KNEE 20 (SOFTGOODS) ×2
IMMOBILIZER KNEE 20 THIGH 36 (SOFTGOODS) ×1 IMPLANT
KIT BASIN OR (CUSTOM PROCEDURE TRAY) ×2 IMPLANT
MANIFOLD NEPTUNE II (INSTRUMENTS) ×2 IMPLANT
NS IRRIG 1000ML POUR BTL (IV SOLUTION) ×2 IMPLANT
PACK TOTAL JOINT (CUSTOM PROCEDURE TRAY) ×2 IMPLANT
PAD ABD 7.5X8 STRL (GAUZE/BANDAGES/DRESSINGS) ×2 IMPLANT
PADDING CAST COTTON 6X4 STRL (CAST SUPPLIES) ×2 IMPLANT
POSITIONER SURGICAL ARM (MISCELLANEOUS) ×2 IMPLANT
SET HNDPC FAN SPRY TIP SCT (DISPOSABLE) ×1 IMPLANT
SPONGE GAUZE 4X4 12PLY (GAUZE/BANDAGES/DRESSINGS) ×2 IMPLANT
STRIP CLOSURE SKIN 1/2X4 (GAUZE/BANDAGES/DRESSINGS) ×2 IMPLANT
SUCTION FRAZIER 12FR DISP (SUCTIONS) ×2 IMPLANT
SUT MNCRL AB 4-0 PS2 18 (SUTURE) ×2 IMPLANT
SUT VIC AB 2-0 CT1 27 (SUTURE) ×3
SUT VIC AB 2-0 CT1 TAPERPNT 27 (SUTURE) ×3 IMPLANT
SUT VLOC 180 0 24IN GS25 (SUTURE) ×2 IMPLANT
TOWEL OR 17X26 10 PK STRL BLUE (TOWEL DISPOSABLE) ×4 IMPLANT
TRAY FOLEY CATH 14FRSI W/METER (CATHETERS) ×2 IMPLANT
WATER STERILE IRR 1500ML POUR (IV SOLUTION) ×4 IMPLANT
WRAP KNEE MAXI GEL POST OP (GAUZE/BANDAGES/DRESSINGS) ×2 IMPLANT

## 2012-04-19 NOTE — Interval H&P Note (Signed)
History and Physical Interval Note:  04/19/2012 12:36 PM  Kenneth Shaw  has presented today for surgery, with the diagnosis of OA OF THE LEFT KNEE  The various methods of treatment have been discussed with the patient and family. After consideration of risks, benefits and other options for treatment, the patient has consented to  Procedure(s) (LRB) with comments: TOTAL KNEE ARTHROPLASTY (Left) as a surgical intervention .  The patient's history has been reviewed, patient examined, no change in status, stable for surgery.  I have reviewed the patient's chart and labs.  Questions were answered to the patient's satisfaction.     Loanne Drilling

## 2012-04-19 NOTE — Progress Notes (Signed)
Placed patient on BiPAP 10/7 per patient. With patients home mask and tubing. Patient tolerating well at this time. RT will monitor.

## 2012-04-19 NOTE — H&P (View-Only) (Signed)
Kenneth Shaw  DOB: 05/10/1956 Married / Language: English / Race: Ayy[place] Male  Date of Admission:  04/19/2012  Chief Complaint:  Left Knee Pain  History of Present Illness The patient is a 56 year old male who comes in for a preoperative History and Physical. The patient is scheduled for a left total knee arthroplasty to be performed by Dr. Frank V. Aluisio, MD at [facility] on 04/19/2012. The patient is a 56 year old male who presents with knee complaints. The patient was seen today changing from Dr. Beane's care. The patient reports left knee and right knee symptoms including: pain which began 30 year(s) (or more) ago. Note for "Knee pain": He states he has torn his ACLs in the past, but has not had surgery on the right knee. He has had two surgeries on his left knee. His left knee is the most problematic. He states he is able to deal with the pain right now, because it is minimal. He says he is "following in my Dad's footsteps" and is starting to notice his knees are bowing. He was told by Dr. Beane in 2008, he would eventually need a total knee. He is here today to get a baseline. For future reference, he does have sleep apnea. He has had previous injections of cortisone and viscosupplements without benefit. He states he hurts most to all times but says he can tolerate the pain. He is having increasing functional difficulties though. He is not able to do a lot of what he desires. He feels he is at a young age and would like to be more active but the knee is preventing him from doing so. He is not having any right knee pain. No hip pain, lower extremity weakness or paresthesia with this. He is ready to proceed with surgery. They have been treated conservatively in the past for the above stated problem and despite conservative measures, they continue to have progressive pain and severe functional limitations and dysfunction. They have failed non-operative management  including home exercise, medications, and injections. It is felt that they would benefit from undergoing total joint replacement. Risks and benefits of the procedure have been discussed with the patient and they elect to proceed with surgery. There are no active contraindications to surgery such as ongoing infection or rapidly progressive neurological disease.   Problem List Osteoarthrosis NOS, lower leg (715.96). 07/05/2006  Allergies No Known Drug Allergies  Family History Cancer. grandmother fathers side Chronic Obstructive Lung Disease. father Diabetes Mellitus. grandmother mothers side Heart Disease. mother and grandfather mothers side Hypertension. mother Other medical problems. grandfather (mother), my uncles (mothers brothers) heart attacks and died before 60   Social History Drug/Alcohol Rehab (Currently). no Exercise. Exercises daily; does gym / weights Illicit drug use. no Current work status. working full time Alcohol use. former drinker Children. 2 Previously in rehab. no Tobacco / smoke exposure. no Tobacco use. former smoker; smoke(d) 3 or more pack(s) per day; uses less than half 1/2 can(s) smokeless per week Pain Contract. no Living situation. live with spouse Most recent primary occupation. CPA, chief financial officer Number of flights of stairs before winded. greater than 5 Post-Surgical Plans. Plan is to go home. Advance Directives. Living Will, Healthcare POA   Medication History Pravastatin Sodium (40MG Tablet, Oral) Active. Aspirin EC (81MG Tablet DR, Oral) Active. Niacin CR ( Oral) Specific dose unknown - Active. Multivitamins ( Oral) Active. Vitamin D ( Oral) Specific dose unknown - Active.   Past Surgical   History Vasectomy Arthroscopy of Knee. left Other Surgery. hernia surgery, two left knee surgerys Tonsillectomy   Medical History Sleep Apnea. uses BiPAP Diabetes Mellitus, Type II. Most recent Hgb A1C  was 5.6 noted on 03/14/12 Hypercholesterolemia Other disease, cancer, significant illness. Note: diet and exercise control the Type II diabetes and cholesterol w/ light meds Mumps Multinodular Goiter   Review of Systems General:Not Present- Chills, Fever, Night Sweats, Fatigue, Weight Gain, Weight Loss and Memory Loss. Skin:Not Present- Hives, Itching, Rash, Eczema and Lesions. HEENT:Not Present- Tinnitus, Headache, Double Vision, Visual Loss, Hearing Loss and Dentures. Respiratory:Not Present- Shortness of breath with exertion, Shortness of breath at rest, Allergies, Coughing up blood and Chronic Cough. Cardiovascular:Not Present- Chest Pain, Racing/skipping heartbeats, Difficulty Breathing Lying Down, Murmur, Swelling and Palpitations. Gastrointestinal:Not Present- Bloody Stool, Heartburn, Abdominal Pain, Vomiting, Nausea, Constipation, Diarrhea, Difficulty Swallowing, Jaundice and Loss of appetitie. Male Genitourinary:Not Present- Urinary frequency, Blood in Urine, Weak urinary stream, Discharge, Flank Pain, Incontinence, Painful Urination, Urgency, Urinary Retention and Urinating at Night. Musculoskeletal:Present- Joint Pain. Not Present- Muscle Weakness, Muscle Pain, Joint Swelling, Back Pain, Morning Stiffness and Spasms. Neurological:Not Present- Tremor, Dizziness, Blackout spells, Paralysis, Difficulty with balance and Weakness. Psychiatric:Not Present- Insomnia.   Vitals Weight: 228 lb Height: 75 in Weight was reported by patient. Height was reported by patient. Body Surface Area: 2.34 m Body Mass Index: 28.5 kg/m Pulse: 68 (Regular) Resp.: 12 (Unlabored) BP: 114/78 (Sitting, Left Arm, Standard)    Physical Exam The physical exam findings are as follows:  Note: Patient is a 56 year old male with continued knee pain.   General Mental Status - Alert, cooperative and good historian. General Appearance- pleasant. Not in acute distress.  Orientation- Oriented X3. Build & Nutrition- Lean, Well nourished and Well developed.   Head and Neck Head- normocephalic, atraumatic . Neck Global Assessment- supple. no bruit auscultated on the right and no bruit auscultated on the left.   Eye Pupil- Bilateral- Regular and Round. Note: wears glasses Motion- Bilateral- EOMI.   Chest and Lung Exam Auscultation: Breath sounds:- clear at anterior chest wall and - clear at posterior chest wall. Adventitious sounds:- No Adventitious sounds.   Cardiovascular Auscultation:Rhythm- Regular rate and rhythm. Heart Sounds- S1 WNL and S2 WNL. Murmurs & Other Heart Sounds:Auscultation of the heart reveals - No Murmurs.   Abdomen Palpation/Percussion:Tenderness- Abdomen is non-tender to palpation. Rigidity (guarding)- Abdomen is soft. Auscultation:Auscultation of the abdomen reveals - Bowel sounds normal.   Male Genitourinary Not done, not pertinent to present illness  Musculoskeletal Evaluation of his right knee shows no effusion. Range is 0-135. Slight crepitus on range of motion. No joint line tenderness or instability. Both hips have normal range of motion without discomfort. The left knee has slight varus. Range about 5-130 with marked crepitus on range of motion. Tenderness medial greater than lateral. No instability noted. Pulse, sensation and motor intact distally.  RADIOGRAPHS: AP both knees and lateral show advanced arthritic change in the left knee bone on bone medial and patellofemoral with large osteophyte formation. Right knee shows very mild narrowing on the medial joint space.  Assessment & Plan Osteoarthrosis NOS, lower leg (715.96) Impression: Left Knee  Note: Plan is for left total knee replacement by Dr. Aluisio.  Plan is to go home.  PCP - Dr. William Hopper - Patient has been seen preoperatively and felt to be stable for surgery.   Signed electronically by DREW L  Duell Holdren, PA-C 

## 2012-04-19 NOTE — Transfer of Care (Signed)
Immediate Anesthesia Transfer of Care Note  Patient: Kenneth Shaw  Procedure(s) Performed: Procedure(s) (LRB) with comments: TOTAL KNEE ARTHROPLASTY (Left)  Patient Location: PACU  Anesthesia Type:Regional and Spinal  Level of Consciousness: awake, alert , oriented and patient cooperative  Airway & Oxygen Therapy: Patient Spontanous Breathing and Patient connected to face mask oxygen  Post-op Assessment: Report given to PACU RN and Post -op Vital signs reviewed and stable  Post vital signs: Reviewed and stable  Complications: No apparent anesthesia complications

## 2012-04-19 NOTE — Anesthesia Procedure Notes (Signed)
Spinal  Patient location during procedure: OR Start time: 04/19/2012 1:22 PM End time: 04/19/2012 1:27 PM Staffing Anesthesiologist: Lucille Passy F Performed by: anesthesiologist  Preanesthetic Checklist Completed: patient identified, site marked, surgical consent, pre-op evaluation, timeout performed, IV checked, risks and benefits discussed and monitors and equipment checked Spinal Block Patient position: sitting Prep: Betadine Patient monitoring: heart rate, continuous pulse ox and blood pressure Injection technique: single-shot Needle Needle type: Quincke  Needle gauge: 22 G Needle length: 9 cm Additional Notes Expiration date of kit checked and confirmed. Patient tolerated procedure well, without complications.  Heme+ CSF on initial needle placement. Needle repositioned, negative for heme/paresthesia. Lot [id number] DOE 05/2013

## 2012-04-19 NOTE — Anesthesia Postprocedure Evaluation (Signed)
Anesthesia Post Note  Patient: Kenneth Shaw  Procedure(s) Performed: Procedure(s) (LRB): TOTAL KNEE ARTHROPLASTY (Left)  Anesthesia type: General  Patient location: PACU  Post pain: Pain level controlled  Post assessment: Post-op Vital signs reviewed  Last Vitals:  Filed Vitals:   04/19/12 1515  BP: 125/66  Pulse: 60  Temp:   Resp: 12    Post vital signs: Reviewed  Level of consciousness: sedated  Complications: No apparent anesthesia complications

## 2012-04-19 NOTE — H&P (Signed)
Kenneth Shaw  DOB: 1955-10-26 Married / Language: English / Race: [place] Male  Date of Admission:  04/19/2012  Chief Complaint:  Left Knee Pain  History of Present Illness The patient is a 56 year old male who comes in for a preoperative History and Physical. The patient is scheduled for a left total knee arthroplasty to be performed by Dr. Gus Rankin. Aluisio, MD at [facility] on 04/19/2012. The patient is a 56 year old male who presents with knee complaints. The patient was seen today changing from Dr. Ermelinda Das care. The patient reports left knee and right knee symptoms including: pain which began 30 year(s) (or more) ago. Note for "Knee pain": He states he has torn his ACLs in the past, but has not had surgery on the right knee. He has had two surgeries on his left knee. His left knee is the most problematic. He states he is able to deal with the pain right now, because it is minimal. He says he is "following in my Dad's footsteps" and is starting to notice his knees are bowing. He was told by Dr. Shelle Iron in 2008, he would eventually need a total knee. He is here today to get a baseline. For future reference, he does have sleep apnea. He has had previous injections of cortisone and viscosupplements without benefit. He states he hurts most to all times but says he can tolerate the pain. He is having increasing functional difficulties though. He is not able to do a lot of what he desires. He feels he is at a young age and would like to be more active but the knee is preventing him from doing so. He is not having any right knee pain. No hip pain, lower extremity weakness or paresthesia with this. He is ready to proceed with surgery. They have been treated conservatively in the past for the above stated problem and despite conservative measures, they continue to have progressive pain and severe functional limitations and dysfunction. They have failed non-operative management  including home exercise, medications, and injections. It is felt that they would benefit from undergoing total joint replacement. Risks and benefits of the procedure have been discussed with the patient and they elect to proceed with surgery. There are no active contraindications to surgery such as ongoing infection or rapidly progressive neurological disease.   Problem List Osteoarthrosis NOS, lower leg (715.96). 07/05/2006  Allergies No Known Drug Allergies  Family History Cancer. grandmother fathers side Chronic Obstructive Lung Disease. father Diabetes Mellitus. grandmother mothers side Heart Disease. mother and grandfather mothers side Hypertension. mother Other medical problems. grandfather (mother), my uncles (mothers brothers) heart attacks and died before 54   Social History Drug/Alcohol Rehab (Currently). no Exercise. Exercises daily; does gym / weights Illicit drug use. no Current work status. working full time Alcohol use. former drinker Children. 2 Previously in rehab. no Tobacco / smoke exposure. no Tobacco use. former smoker; smoke(d) 3 or more pack(s) per day; uses less than half 1/2 can(s) smokeless per week Pain Contract. no Living situation. live with spouse Most recent primary occupation. IT trainer, chief Radiographer, therapeutic Number of flights of stairs before winded. greater than 5 Post-Surgical Plans. Plan is to go home. Advance Directives. Living Will, Healthcare POA   Medication History Pravastatin Sodium (40MG  Tablet, Oral) Active. Aspirin EC (81MG  Tablet DR, Oral) Active. Niacin CR ( Oral) Specific dose unknown - Active. Multivitamins ( Oral) Active. Vitamin D ( Oral) Specific dose unknown - Active.   Past Surgical  History Vasectomy Arthroscopy of Knee. left Other Surgery. hernia surgery, two left knee surgerys Tonsillectomy   Medical History Sleep Apnea. uses BiPAP Diabetes Mellitus, Type II. Most recent Hgb A1C  was 5.6 noted on 03/14/12 Hypercholesterolemia Other disease, cancer, significant illness. Note: diet and exercise control the Type II diabetes and cholesterol w/ light meds Mumps Multinodular Goiter   Review of Systems General:Not Present- Chills, Fever, Night Sweats, Fatigue, Weight Gain, Weight Loss and Memory Loss. Skin:Not Present- Hives, Itching, Rash, Eczema and Lesions. HEENT:Not Present- Tinnitus, Headache, Double Vision, Visual Loss, Hearing Loss and Dentures. Respiratory:Not Present- Shortness of breath with exertion, Shortness of breath at rest, Allergies, Coughing up blood and Chronic Cough. Cardiovascular:Not Present- Chest Pain, Racing/skipping heartbeats, Difficulty Breathing Lying Down, Murmur, Swelling and Palpitations. Gastrointestinal:Not Present- Bloody Stool, Heartburn, Abdominal Pain, Vomiting, Nausea, Constipation, Diarrhea, Difficulty Swallowing, Jaundice and Loss of appetitie. Male Genitourinary:Not Present- Urinary frequency, Blood in Urine, Weak urinary stream, Discharge, Flank Pain, Incontinence, Painful Urination, Urgency, Urinary Retention and Urinating at Night. Musculoskeletal:Present- Joint Pain. Not Present- Muscle Weakness, Muscle Pain, Joint Swelling, Back Pain, Morning Stiffness and Spasms. Neurological:Not Present- Tremor, Dizziness, Blackout spells, Paralysis, Difficulty with balance and Weakness. Psychiatric:Not Present- Insomnia.   Vitals Weight: 228 lb Height: 75 in Weight was reported by patient. Height was reported by patient. Body Surface Area: 2.34 m Body Mass Index: 28.5 kg/m Pulse: 68 (Regular) Resp.: 12 (Unlabored) BP: 114/78 (Sitting, Left Arm, Standard)    Physical Exam The physical exam findings are as follows:  Note: Patient is a 56 year old male with continued knee pain.   General Mental Status - Alert, cooperative and good historian. General Appearance- pleasant. Not in acute distress.  Orientation- Oriented X3. Build & Nutrition- Lean, Well nourished and Well developed.   Head and Neck Head- normocephalic, atraumatic . Neck Global Assessment- supple. no bruit auscultated on the right and no bruit auscultated on the left.   Eye Pupil- Bilateral- Regular and Round. Note: wears glasses Motion- Bilateral- EOMI.   Chest and Lung Exam Auscultation: Breath sounds:- clear at anterior chest wall and - clear at posterior chest wall. Adventitious sounds:- No Adventitious sounds.   Cardiovascular Auscultation:Rhythm- Regular rate and rhythm. Heart Sounds- S1 WNL and S2 WNL. Murmurs & Other Heart Sounds:Auscultation of the heart reveals - No Murmurs.   Abdomen Palpation/Percussion:Tenderness- Abdomen is non-tender to palpation. Rigidity (guarding)- Abdomen is soft. Auscultation:Auscultation of the abdomen reveals - Bowel sounds normal.   Male Genitourinary Not done, not pertinent to present illness  Musculoskeletal Evaluation of his right knee shows no effusion. Range is 0-135. Slight crepitus on range of motion. No joint line tenderness or instability. Both hips have normal range of motion without discomfort. The left knee has slight varus. Range about 5-130 with marked crepitus on range of motion. Tenderness medial greater than lateral. No instability noted. Pulse, sensation and motor intact distally.  RADIOGRAPHS: AP both knees and lateral show advanced arthritic change in the left knee bone on bone medial and patellofemoral with large osteophyte formation. Right knee shows very mild narrowing on the medial joint space.  Assessment & Plan Osteoarthrosis NOS, lower leg (715.96) Impression: Left Knee  Note: Plan is for left total knee replacement by Dr. Lequita Halt.  Plan is to go home.  PCP - Dr. Marga Melnick - Patient has been seen preoperatively and felt to be stable for surgery.   Signed electronically by Roberts Gaudy, PA-C

## 2012-04-19 NOTE — Op Note (Signed)
Pre-operative diagnosis- Osteoarthritis  Left knee(s)  Post-operative diagnosis- Osteoarthritis Left knee(s)  Procedure-  Left  Total Knee Arthroplasty  Surgeon- Gus Rankin. Marcelina Mclaurin, MD  Assistant- Dimitri Ped, PA-C   Anesthesia-  Spinal EBL-* No blood loss amount entered *  Drains Hemovac  Tourniquet time-  Total Tourniquet Time Documented: Thigh (Left) - 40 minutes   Complications- None  Condition-PACU - hemodynamically stable.   Brief Clinical Note   Kenneth Shaw is a 56 y.o. year old male with end stage OA of his left knee with progressively worsening pain and dysfunction. He has constant pain, with activity and at rest and significant functional deficits with difficulties even with ADLs. He has had extensive non-op management including analgesics, injections of cortisone, and home exercise program, but remains in significant pain with significant dysfunction. Radiographs show bone on bone arthritis medial and patellofemoral. He presents now for left Total Knee Arthroplasty.     Procedure in detail---   The patient is brought into the operating room and positioned supine on the operating table. After successful administration of  Spinal,   a tourniquet is placed high on the  Left thigh(s) and the lower extremity is prepped and draped in the usual sterile fashion. Time out is performed by the operating team and then the  Left lower extremity is wrapped in Esmarch, knee flexed and the tourniquet inflated to 300 mmHg.       A midline incision is made with a ten blade through the subcutaneous tissue to the level of the extensor mechanism. A fresh blade is used to make a medial parapatellar arthrotomy. Soft tissue over the proximal medial tibia is subperiosteally elevated to the joint line with a knife and into the semimembranosus bursa with a Cobb elevator. Soft tissue over the proximal lateral tibia is elevated with attention being paid to avoiding the patellar tendon on the tibial  tubercle. The patella is everted, knee flexed 90 degrees and the ACL and PCL are removed. Findings are bone on bone medial and patellofemoral with massive global osteophytes.        The drill is used to create a starting hole in the distal femur and the canal is thoroughly irrigated with sterile saline to remove the fatty contents. The 5 degree Left  valgus alignment guide is placed into the femoral canal and the distal femoral cutting block is pinned to remove 11 mm off the distal femur. Resection is made with an oscillating saw.      The tibia is subluxed forward and the menisci are removed. The extramedullary alignment guide is placed referencing proximally at the medial aspect of the tibial tubercle and distally along the second metatarsal axis and tibial crest. The block is pinned to remove 2mm off the more deficient medial side. Resection is made with an oscillating saw. Size 5is the most appropriate size for the tibia and the proximal tibia is prepared with the modular drill and keel punch for that size.      The femoral sizing guide is placed and size 5 is most appropriate. Rotation is marked off the epicondylar axis and confirmed by creating a rectangular flexion gap at 90 degrees. The size 5 cutting block is pinned in this rotation and the anterior, posterior and chamfer cuts are made with the oscillating saw. The intercondylar block is then placed and that cut is made.      Trial size 5 tibial component, trial size 5 posterior stabilized femur and a 15  mm  posterior stabilized rotating platform insert trial is placed. Full extension is achieved with excellent varus/valgus and anterior/posterior balance throughout full range of motion. The patella is everted and thickness measured to be 27  mm. Free hand resection is taken to 15 mm, a 41 template is placed, lug holes are drilled, trial patella is placed, and it tracks normally. Osteophytes are removed off the posterior femur with the trial in place.  All trials are removed and the cut bone surfaces prepared with pulsatile lavage. Cement is mixed and once ready for implantation, the size 5 tibial implant, size  5 posterior stabilized femoral component, and the size 41 patella are cemented in place and the patella is held with the clamp. The trial insert is placed and the knee held in full extension. All extruded cement is removed and once the cement is hard the permanent 15 mm posterior stabilized rotating platform insert is placed into the tibial tray.      The wound is copiously irrigated with saline solution and the extensor mechanism closed over a hemovac drain with #1 PDS suture. The tourniquet is released for a total tourniquet time of 40  minutes. Flexion against gravity is 140 degrees and the patella tracks normally. Subcutaneous tissue is closed with 2.0 vicryl and subcuticular with running 4.0 Monocryl. The catheter for the Marcaine pain pump is placed and the pump is initiated. The incision is cleaned and dried and steri-strips and a bulky sterile dressing are applied. The limb is placed into a knee immobilizer and the patient is awakened and transported to recovery in stable condition.      Please note that a surgical assistant was a medical necessity for this procedure in order to perform it in a safe and expeditious manner. Surgical assistant was necessary to retract the ligaments and vital neurovascular structures to prevent injury to them and also necessary for proper positioning of the limb to allow for anatomic placement of the prosthesis.   Gus Rankin Samule Life, MD    04/19/2012, 2:34 PM

## 2012-04-19 NOTE — Anesthesia Preprocedure Evaluation (Signed)
Anesthesia Evaluation  Patient identified by MRN, date of birth, ID band Patient awake    Reviewed: Allergy & Precautions, H&P , NPO status , Patient's Chart, lab work & pertinent test results  Airway Mallampati: II TM Distance: >3 FB Neck ROM: Full    Dental  (+) Teeth Intact and Dental Advisory Given   Pulmonary sleep apnea and Continuous Positive Airway Pressure Ventilation , former smoker,  breath sounds clear to auscultation  Pulmonary exam normal       Cardiovascular negative cardio ROS  Rhythm:Regular Rate:Normal     Neuro/Psych PSYCHIATRIC DISORDERS negative neurological ROS     GI/Hepatic negative GI ROS, Neg liver ROS,   Endo/Other  negative endocrine ROSdiabetes (diet control)Morbid obesity  Renal/GU negative Renal ROS  negative genitourinary   Musculoskeletal negative musculoskeletal ROS (+)   Abdominal   Peds  Hematology negative hematology ROS (+)   Anesthesia Other Findings   Reproductive/Obstetrics negative OB ROS                           Anesthesia Physical Anesthesia Plan  ASA: II  Anesthesia Plan: Spinal   Post-op Pain Management:    Induction:   Airway Management Planned: Simple Face Mask  Additional Equipment:   Intra-op Plan:   Post-operative Plan:   Informed Consent: I have reviewed the patients History and Physical, chart, labs and discussed the procedure including the risks, benefits and alternatives for the proposed anesthesia with the patient or authorized representative who has indicated his/her understanding and acceptance.   Dental advisory given  Plan Discussed with: CRNA  Anesthesia Plan Comments:         Anesthesia Quick Evaluation

## 2012-04-20 DIAGNOSIS — E871 Hypo-osmolality and hyponatremia: Secondary | ICD-10-CM

## 2012-04-20 DIAGNOSIS — D62 Acute posthemorrhagic anemia: Secondary | ICD-10-CM

## 2012-04-20 LAB — BASIC METABOLIC PANEL
BUN: 12 mg/dL (ref 6–23)
Chloride: 100 mEq/L (ref 96–112)
GFR calc Af Amer: >90 mL/min (ref >90)
GFR calc non Af Amer: >90 mL/min (ref >90)
Glucose, Bld: 172 mg/dL — ABNORMAL HIGH (ref 70–99)
Potassium: 4.1 mEq/L (ref 3.5–5.1)
Sodium: 133 mEq/L — ABNORMAL LOW (ref 135–145)

## 2012-04-20 LAB — CBC
HCT: 35.3 % — ABNORMAL LOW (ref 39.0–52.0)
Hemoglobin: 12.1 g/dL — ABNORMAL LOW (ref 13.0–17.0)
WBC: 14.1 10*3/uL — ABNORMAL HIGH (ref 4.0–10.5)

## 2012-04-20 NOTE — Evaluation (Signed)
Occupational Therapy Evaluation Patient Details Name: Kenneth Shaw MRN: [id number] DOB: [birth year] Today's Date: 04/20/2012 Time: 6295-2841 OT Time Calculation (min): 16 min  OT Assessment / Plan / Recommendation Clinical Impression  Pt doing very well POD1 LTKR. All education completed. Pt will have prn A at home from family.    OT Assessment  Patient does not need any further OT services    Follow Up Recommendations  No OT follow up    Barriers to Discharge      Equipment Recommendations  None recommended by OT    Recommendations for Other Services    Frequency       Precautions / Restrictions Precautions Precautions: Knee Required Braces or Orthoses: Knee Immobilizer - Left Knee Immobilizer - Left: Discontinue once straight leg raise with < 10 degree lag Restrictions LLE Weight Bearing: Weight bearing as tolerated   Pertinent Vitals/Pain Pt reported 5/10 pain with activity. Repositioned for comfort.    ADL  Grooming: Performed;Wash/dry hands;Supervision/safety Where Assessed - Grooming: Supported Copywriter, advertising: Research scientist (life sciences) Method: Sit to Barista: Regular height toilet Toileting - Architect and Hygiene: Performed;Supervision/safety Where Assessed - Engineer, mining and Hygiene: Sit to stand from 3-in-1 or toilet Equipment Used: Rolling walker Transfers/Ambulation Related to ADLs: Pt ambulated to the bathroom with supervision. ADL Comments: Wife will be assisting pt with bathing and dressing. Pt will be spongebathing until able to step into the tub.    OT Diagnosis:    OT Problem List:   OT Treatment Interventions:     OT Goals    Visit Information  Last OT Received On: 04/20/12 Assistance Needed: +1    Subjective Data  Subjective: I've got things pretty much figured out. Patient Stated Goal: Not asked   Prior Functioning     Home Living Lives With:  Spouse Available Help at Discharge: Family Type of Home: House Home Access: Stairs to enter Secretary/administrator of Steps: 3 Entrance Stairs-Rails: None Home Layout: One level Bathroom Shower/Tub: Engineer, manufacturing systems: Standard Home Adaptive Equipment: Grab bars in shower;Walker - rolling;Crutches Prior Function Level of Independence: Independent Able to Take Stairs?: Yes Driving: Yes Vocation: Full time employment Communication Communication: No difficulties Dominant Hand: Right         Vision/Perception     Cognition  Overall Cognitive Status: Appears within functional limits for tasks assessed/performed Arousal/Alertness: Awake/alert Orientation Level: Appears intact for tasks assessed Behavior During Session: [facility] for tasks performed    Extremity/Trunk Assessment Right Upper Extremity Assessment RUE ROM/Strength/Tone: [facility] for tasks assessed Left Upper Extremity Assessment LUE ROM/Strength/Tone: WFL for tasks assessed     Mobility Bed Mobility Bed Mobility: Sit to Supine Sit to Supine: 5: Supervision;HOB flat Details for Bed Mobility Assistance: min cues to use RLE to self assist. Transfers Sit to Stand: 5: Supervision;From chair/3-in-1;From toilet;With upper extremity assist;With armrests Stand to Sit: 5: Supervision;To toilet;To bed;With upper extremity assist;With armrests Details for Transfer Assistance: Pt had no difficulty completing sit<>stand from std toilet.     Shoulder Instructions     Exercise     Balance     End of Session OT - End of Session Activity Tolerance: Patient tolerated treatment well Patient left: in chair;with call bell/phone within reach  GO     Korynn Kenedy A OTR/L [phone] 04/20/2012, 2:33 PM

## 2012-04-20 NOTE — Plan of Care (Signed)
Problem: Phase I Progression Outcomes Goal: Dangle or out of bed evening of surgery Outcome: Completed/Met Date Met:  04/20/12 x2

## 2012-04-20 NOTE — Evaluation (Signed)
Physical Therapy Evaluation Patient Details Name: Kenneth Shaw MRN: [id number] DOB: 01-11-1956 Today's Date: 04/20/2012 Time: 9147-8295 PT Time Calculation (min): 38 min  PT Assessment / Plan / Recommendation Clinical Impression  Pt s/p L TKR presents with decreased L LE strength/ROM limiting functional mobility    PT Assessment  Patient needs continued PT services    Follow Up Recommendations  Home health PT    Does the patient have the potential to tolerate intense rehabilitation      Barriers to Discharge None      Equipment Recommendations  None recommended by PT    Recommendations for Other Services OT consult   Frequency 7X/week    Precautions / Restrictions Precautions Precautions: Knee Required Braces or Orthoses: Knee Immobilizer - Left Knee Immobilizer - Left: Discontinue once straight leg raise with < 10 degree lag (Pt performed IND SLR this am) Restrictions Weight Bearing Restrictions: No LLE Weight Bearing: Weight bearing as tolerated   Pertinent Vitals/Pain 6/10; premedicated, cold packs provided      Mobility  Bed Mobility Bed Mobility: Supine to Sit Supine to Sit: 4: Min assist Details for Bed Mobility Assistance: min cues for sequence and assist with L LE Transfers Transfers: Sit to Stand;Stand to Sit Sit to Stand: 4: Min assist Stand to Sit: 4: Min assist Details for Transfer Assistance: cues for use of UEs and for LE management Ambulation/Gait Ambulation/Gait Assistance: 4: Min Environmental consultant (Feet): 100 Feet Assistive device: Rolling walker Ambulation/Gait Assistance Details: cues for sequence, posture, position from RW and stride length Gait Pattern: Step-to pattern    Shoulder Instructions     Exercises Total Joint Exercises Ankle Circles/Pumps: AROM;10 reps;Supine;Both Quad Sets: AROM;10 reps;Supine;Both Heel Slides: AAROM;10 reps;Supine;Left Straight Leg Raises: AROM;AAROM;15 reps;Left;Supine   PT Diagnosis:  Difficulty walking  PT Problem List: Decreased strength;Decreased range of motion;Decreased activity tolerance;Decreased mobility;Pain;Decreased knowledge of use of DME PT Treatment Interventions: DME instruction;Gait training;Stair training;Functional mobility training;Therapeutic activities;Therapeutic exercise;Patient/family education   PT Goals Acute Rehab PT Goals PT Goal Formulation: With patient Time For Goal Achievement: 04/24/12 Potential to Achieve Goals: Good Pt will go Supine/Side to Sit: with supervision PT Goal: Supine/Side to Sit - Progress: Goal set today Pt will go Sit to Supine/Side: with supervision PT Goal: Sit to Supine/Side - Progress: Goal set today Pt will go Sit to Stand: with supervision PT Goal: Sit to Stand - Progress: Goal set today Pt will go Stand to Sit: with supervision PT Goal: Stand to Sit - Progress: Goal set today Pt will Ambulate: >150 feet;with supervision;with rolling walker PT Goal: Ambulate - Progress: Goal set today Pt will Go Up / Down Stairs: 3-5 stairs;with min assist;with least restrictive assistive device PT Goal: Up/Down Stairs - Progress: Goal set today  Visit Information  Last PT Received On: 04/20/12 Assistance Needed: +1    Subjective Data  Subjective: I think I'm doing pretty good Patient Stated Goal: Resume previous lifestyle with decreased pain   Prior Functioning  Home Living Lives With: Spouse Available Help at Discharge: Family Type of Home: House Home Access: Stairs to enter Secretary/administrator of Steps: 3 Entrance Stairs-Rails: None Home Layout: One level Home Adaptive Equipment: Walker - rolling;Crutches Prior Function Level of Independence: Independent Able to Take Stairs?: Yes Driving: Yes Vocation: Full time employment Communication Communication: No difficulties    Cognition  Overall Cognitive Status: Appears within functional limits for tasks assessed/performed Arousal/Alertness:  Awake/alert Orientation Level: Appears intact for tasks assessed Behavior During Session: [facility] for tasks  performed    Extremity/Trunk Assessment Right Upper Extremity Assessment RUE ROM/Strength/Tone: [facility] for tasks assessed Left Upper Extremity Assessment LUE ROM/Strength/Tone: WFL for tasks assessed Right Lower Extremity Assessment RLE ROM/Strength/Tone: [facility] for tasks assessed Left Lower Extremity Assessment LLE ROM/Strength/Tone: Deficits LLE ROM/Strength/Tone Deficits: 3/5 quads with AAROM at knee -10 - 75   Balance    End of Session    GP     Kenneth Shaw 04/20/2012, 12:27 PM

## 2012-04-20 NOTE — Progress Notes (Signed)
Physical Therapy Treatment Patient Details Name: Kenneth Shaw MRN: [id number] DOB: Nov 26, 1955 Today's Date: 04/20/2012 Time: 4098-1191 PT Time Calculation (min): 23 min  PT Assessment / Plan / Recommendation Comments on Treatment Session       Follow Up Recommendations  Home health PT     Does the patient have the potential to tolerate intense rehabilitation     Barriers to Discharge        Equipment Recommendations  None recommended by OT    Recommendations for Other Services OT consult  Frequency 7X/week   Plan Discharge plan remains appropriate    Precautions / Restrictions Precautions Precautions: Knee Required Braces or Orthoses: Knee Immobilizer - Left Knee Immobilizer - Left: Discontinue once straight leg raise with < 10 degree lag Restrictions Weight Bearing Restrictions: No LLE Weight Bearing: Weight bearing as tolerated   Pertinent Vitals/Pain     Mobility  Bed Mobility Bed Mobility: Sit to Supine Sit to Supine: 5: Supervision;HOB flat Details for Bed Mobility Assistance: min cues to use RLE to self assist. Transfers Transfers: Sit to Stand;Stand to Sit Sit to Stand: 5: Supervision;From chair/3-in-1;From toilet;With upper extremity assist;With armrests Stand to Sit: 5: Supervision;To toilet;To bed;With upper extremity assist;With armrests Details for Transfer Assistance: Pt had no difficulty completing sit<>stand from std toilet. Ambulation/Gait Ambulation/Gait Assistance: 4: Min guard Ambulation Distance (Feet): 200 Feet Assistive device: Rolling walker Ambulation/Gait Assistance Details: cues for position from RW Gait Pattern: Step-to pattern    Exercises     PT Diagnosis:    PT Problem List:   PT Treatment Interventions:     PT Goals Acute Rehab PT Goals PT Goal Formulation: With patient Time For Goal Achievement: 04/24/12 Potential to Achieve Goals: Good Pt will go Supine/Side to Sit: with supervision PT Goal: Supine/Side to Sit -  Progress: Progressing toward goal Pt will go Sit to Supine/Side: with supervision PT Goal: Sit to Supine/Side - Progress: Progressing toward goal Pt will go Sit to Stand: with supervision PT Goal: Sit to Stand - Progress: Progressing toward goal Pt will go Stand to Sit: with supervision PT Goal: Stand to Sit - Progress: Progressing toward goal Pt will Ambulate: >150 feet;with supervision;with rolling walker PT Goal: Ambulate - Progress: Progressing toward goal Pt will Go Up / Down Stairs: 3-5 stairs;with min assist;with least restrictive assistive device PT Goal: Up/Down Stairs - Progress: Goal set today  Visit Information  Last PT Received On: 04/20/12 Assistance Needed: +1    Subjective Data  Subjective: I'm ready Patient Stated Goal: Resume previous lifestyle with decreased pain   Cognition  Overall Cognitive Status: Appears within functional limits for tasks assessed/performed Arousal/Alertness: Awake/alert Orientation Level: Appears intact for tasks assessed Behavior During Session: [facility] for tasks performed    Balance     End of Session     GP     Kenneth Shaw 04/20/2012, 3:52 PM

## 2012-04-20 NOTE — Progress Notes (Signed)
Pt already wearing his bipap & resting comfortably at this time.  Jacqulynn Cadet RRT

## 2012-04-20 NOTE — Care Management Note (Addendum)
    Page 1 of 2   04/23/2012     5:40:54 PM   CARE MANAGEMENT NOTE 04/23/2012  Patient:  Kenneth Shaw, Kenneth Shaw   Account Number:  [id number]  Date Initiated:  04/20/2012  Documentation initiated by:  Colleen Can  Subjective/Objective Assessment:   dx left knee arthroplasty     Action/Plan:   C spoke with pt. Plans to go home where spouse will be caregiver. Already has DME_RW, Gilmer Mor  Wants [facility] for [facility] services   Anticipated DC Date:  04/22/2012   Anticipated DC Plan:  HOME W HOME HEALTH SERVICES  In-house referral  NA      DC Planning Services  CM consult      [facility] Choice  HOME HEALTH   Choice offered to / List presented to:  C-1 Patient   DME arranged  NA      DME agency  NA     HH arranged  HH-2 PT      HH agency  [facility]   Status of service:  Completed, signed off Medicare Important Message given?  NO (If response is "NO", the following Medicare IM given date fields will be blank) Date Medicare IM given:   Date Additional Medicare IM given:    Discharge Disposition:  HOME W HOME HEALTH SERVICES  Per UR Regulation:  Reviewed for med. necessity/level of care/duration of stay  If discussed at Long Length of Stay Meetings, dates discussed:    Comments:  04/20/2012 Damaris Schooner RN CCM [phone] [facility]  rep advised that they can provide services with start date of day after discharge.

## 2012-04-20 NOTE — Progress Notes (Signed)
   Subjective: 1 Day Post-Op Procedure(s) (LRB): TOTAL KNEE ARTHROPLASTY (Left) Patient reports pain as mild.   Patient seen in rounds for Dr. Lequita Halt.  Wife in room at bedside. Patient is well, and has had no acute complaints or problems We will start therapy today.  Plan is to go Home after hospital stay.  Objective: Vital signs in last 24 hours: Temp:  [97.5 F (36.4 C)-99.7 F (37.6 C)] 98.6 F (37 C) (11/15 0647) Pulse Rate:  [51-81] 81  (11/15 0647) Resp:  [11-16] 16  (11/15 0647) BP: (106-147)/(59-89) 110/67 mmHg (11/15 0647) SpO2:  [94 %-100 %] 97 % (11/15 0647) FiO2 (%):  [99 %] 99 % (11/14 1600) Weight:  [108.41 kg (239 lb)] 108.41 kg (239 lb) (11/14 1600)  Intake/Output from previous day:  Intake/Output Summary (Last 24 hours) at 04/20/12 0714 Last data filed at 04/20/12 0648  Gross per 24 hour  Intake 6048.33 ml  Output   2560 ml  Net 3488.33 ml    Intake/Output this shift:    Labs:  Basename 04/20/12 0431  HGB 12.1*    Basename 04/20/12 0431  WBC 14.1*  RBC 3.83*  HCT 35.3*  PLT 172    Basename 04/20/12 0431  NA 133*  K 4.1  CL 100  CO2 25  BUN 12  CREATININE 0.85  GLUCOSE 172*  CALCIUM 8.3*   No results found for this basename: LABPT:2,INR:2 in the last 72 hours  EXAM General - Patient is Alert, Appropriate and Oriented Extremity - Neurovascular intact Sensation intact distally Dorsiflexion/Plantar flexion intact Dressing - dressing C/D/I Motor Function - intact, moving foot and toes well on exam.  Hemovac pulled without difficulty.  Past Medical History  Diagnosis Date  . History of sleep apnea     BiPAP  . Anisocoria     chronic  . Nonspecific ST-T changes     early repolarization  . Hyperlipidemia   . Diverticulosis   . ADD (attention deficit disorder)     no official testing  . fracture as child    left arm no surgery  . Sleep apnea     bipap 10.7 inhale 7 on exhale  . DM (diabetes mellitus)     diet controlled    . Basal cell cancer  on chest 10 yrs ago    Dr Jorja Loa  . Arthritis   . Goiter     SMALL    Assessment/Plan: 1 Day Post-Op Procedure(s) (LRB): TOTAL KNEE ARTHROPLASTY (Left) Principal Problem:  *OA (osteoarthritis) of knee Active Problems:  Postop Hyponatremia  Postop Acute blood loss anemia  Estimated Body mass index is 29.87 kg/(m^2) as calculated from the following:   Height as of this encounter: 6\' 3" (1.905 m).   Weight as of this encounter: 239 lb(108.41 kg). Advance diet Up with therapy Plan for discharge tomorrow Discharge home with home health  DVT Prophylaxis - Xarelto, 81 mg Aspirin on hold Weight-Bearing as tolerated to left leg No vaccines. D/C O2 and Pulse OX and try on Room Air  Mischa Pollard, Marlowe Sax 04/20/2012, 7:14 AM

## 2012-04-21 LAB — BASIC METABOLIC PANEL
BUN: 15 mg/dL (ref 6–23)
CO2: 26 mEq/L (ref 19–32)
GFR calc non Af Amer: >90 mL/min (ref >90)
Glucose, Bld: 170 mg/dL — ABNORMAL HIGH (ref 70–99)
Potassium: 4.2 mEq/L (ref 3.5–5.1)
Sodium: 136 mEq/L (ref 135–145)

## 2012-04-21 LAB — CBC
HCT: 33 % — ABNORMAL LOW (ref 39.0–52.0)
Hemoglobin: 11.5 g/dL — ABNORMAL LOW (ref 13.0–17.0)
MCH: 31.9 pg (ref 26.0–34.0)
MCHC: 34.8 g/dL (ref 30.0–36.0)
RBC: 3.6 MIL/uL — ABNORMAL LOW (ref 4.22–5.81)

## 2012-04-21 NOTE — Progress Notes (Signed)
Physical Therapy Treatment Patient Details Name: Kenneth Shaw MRN: [id number] DOB: Feb 04, 1956 Today's Date: 04/21/2012 Time: 0800-0825 PT Time Calculation (min): 25 min  PT Assessment / Plan / Recommendation Comments on Treatment Session  Pt doing very well with mobility, ready to DC home from PT standpoint. Stair training completed. Pt is at modified independent level with ambulation. Pt has needed DME.    Follow Up Recommendations  Home health PT     Does the patient have the potential to tolerate intense rehabilitation     Barriers to Discharge        Equipment Recommendations  None recommended by PT    Recommendations for Other Services OT consult  Frequency 7X/week   Plan Discharge plan remains appropriate    Precautions / Restrictions Precautions Precautions: Knee Required Braces or Orthoses: Knee Immobilizer - Left Knee Immobilizer - Left: Discontinue once straight leg raise with < 10 degree lag Restrictions Weight Bearing Restrictions: No LLE Weight Bearing: Weight bearing as tolerated   Pertinent Vitals/Pain **5/10 L knee premedicated*    Mobility  Bed Mobility Bed Mobility: Supine to Sit Supine to Sit: 6: Modified independent (Device/Increase time) Details for Bed Mobility Assistance: pt used RLE to assist LLE OOB Transfers Sit to Stand: With upper extremity assist;From bed;6: Modified independent (Device/Increase time) Stand to Sit: With upper extremity assist;With armrests;6: Modified independent (Device/Increase time);To chair/3-in-1 Ambulation/Gait Ambulation/Gait Assistance: 6: Modified independent (Device/Increase time) Ambulation Distance (Feet): 225 Feet Assistive device: Rolling walker Gait Pattern: Step-to pattern Stairs: Yes Stairs Assistance: 5: Supervision Stairs Assistance Details (indicate cue type and reason): VCs sequencing Stair Management Technique: One rail Right;Forwards (one crutch) Number of Stairs: 3     Exercises Total Joint  Exercises Ankle Circles/Pumps: AROM;10 reps;Supine;Both Quad Sets: AROM;10 reps;Supine;Both Towel Squeeze: AROM;Both;10 reps;Supine Heel Slides: AAROM;10 reps;Supine;Left Straight Leg Raises: AROM;AAROM;Left;Supine;10 reps Long Arc Quad: AROM;Left;10 reps;Seated Knee Flexion: AAROM;Left;10 reps;Seated   PT Diagnosis:    PT Problem List:   PT Treatment Interventions:     PT Goals Acute Rehab PT Goals PT Goal Formulation: With patient Time For Goal Achievement: 04/24/12 Potential to Achieve Goals: Good Pt will go Supine/Side to Sit: with supervision PT Goal: Supine/Side to Sit - Progress: Met Pt will go Sit to Supine/Side: with supervision Pt will go Sit to Stand: with supervision PT Goal: Sit to Stand - Progress: Met Pt will go Stand to Sit: with supervision PT Goal: Stand to Sit - Progress: Met Pt will Ambulate: >150 feet;with supervision;with rolling walker PT Goal: Ambulate - Progress: Met Pt will Go Up / Down Stairs: 3-5 stairs;with min assist;with least restrictive assistive device PT Goal: Up/Down Stairs - Progress: Met  Visit Information  Last PT Received On: 04/21/12 Assistance Needed: +1    Subjective Data  Subjective: I'm ready to progress with my recovery. Patient Stated Goal: to go back to work   Cognition  Overall Cognitive Status: Appears within functional limits for tasks assessed/performed Arousal/Alertness: Awake/alert Orientation Level: Appears intact for tasks assessed Behavior During Session: [facility] for tasks performed    Balance     End of Session PT - End of Session Activity Tolerance: Patient tolerated treatment well Patient left: in bed Nurse Communication: Mobility status CPM Left Knee CPM Left Knee: Off   GP     Ralene Bathe Kistler 04/21/2012, 9:26 AM [phone]

## 2012-04-21 NOTE — Progress Notes (Signed)
Discharge summary sent to payer through [facility]  

## 2012-04-21 NOTE — Progress Notes (Signed)
Orthopedics Progress Note  Subjective: Pt doing well Mild pain to left knee s/p replacement Ready for d/c home  Objective:  Filed Vitals:   04/21/12 0612  BP: 120/71  Pulse: 81  Temp: 98.3 F (36.8 C)  Resp: 16    General: Awake and alert  Musculoskeletal: left knee incision healing well, no erythema or drainage, nv intact distally Neurovascularly intact  Lab Results  Component Value Date   WBC 18.2* 04/21/2012   HGB 11.5* 04/21/2012   HCT 33.0* 04/21/2012   MCV 91.7 04/21/2012   PLT 155 04/21/2012       Component Value Date/Time   NA 136 04/21/2012 0544   K 4.2 04/21/2012 0544   CL 103 04/21/2012 0544   CO2 26 04/21/2012 0544   GLUCOSE 170* 04/21/2012 0544   BUN 15 04/21/2012 0544   CREATININE 0.92 04/21/2012 0544   CALCIUM 8.8 04/21/2012 0544   GFRNONAA >90 04/21/2012 0544   GFRAA >90 04/21/2012 0544    Lab Results  Component Value Date   INR 0.92 04/13/2012    Assessment/Plan: POD #3 s/p Procedure(s):left TOTAL KNEE ARTHROPLASTY  D/c home today F/u in 2 weeks  Almedia Balls. Ranell Patrick, MD 04/21/2012 7:26 AM

## 2012-04-21 NOTE — Discharge Summary (Signed)
Physician Discharge Summary  Patient ID: Kenneth Shaw MRN: [id number] DOB/AGE: 57-20-1957 57 y.o.  Admit date: 04/19/2012 Discharge date: 04/21/2012  Admission Diagnoses:  Principal Problem:  *OA (osteoarthritis) of knee Active Problems:  Postop Hyponatremia  Postop Acute blood loss anemia   Discharge Diagnoses:  Same   Surgeries: Procedure(s): TOTAL KNEE ARTHROPLASTY on 04/19/2012   Consultants: PT/OT  Discharged Condition: Stable  Hospital Course: Kenneth Shaw is an 56 y.o. male who was admitted 04/19/2012 with a chief complaint of No chief complaint on file. , and found to have a diagnosis of OA (osteoarthritis) of knee.  They were brought to the operating room on 04/19/2012 and underwent the above named procedures.    The patient had an uncomplicated hospital course and was stable for discharge.  Recent vital signs:  Filed Vitals:   04/21/12 0612  BP: 120/71  Pulse: 81  Temp: 98.3 F (36.8 C)  Resp: 16    Recent laboratory studies:  Results for orders placed during the hospital encounter of 04/19/12  TYPE AND SCREEN      Component Value Range   ABO/RH(D) O NEG     Antibody Screen NEG     Sample Expiration 04/22/2012    ABO/RH      Component Value Range   ABO/RH(D) O NEG    GLUCOSE, CAPILLARY      Component Value Range   Glucose-Capillary 102 (*) 70 - 99 mg/dL   Comment 1 Notify RN    CBC      Component Value Range   WBC 14.1 (*) 4.0 - 10.5 K/uL   RBC 3.83 (*) 4.22 - 5.81 MIL/uL   Hemoglobin 12.1 (*) 13.0 - 17.0 g/dL   HCT 40.9 (*) 81.1 - 91.4 %   MCV 92.2  78.0 - 100.0 fL   MCH 31.6  26.0 - 34.0 pg   MCHC 34.3  30.0 - 36.0 g/dL   RDW 78.2  95.6 - 21.3 %   Platelets 172  150 - 400 K/uL  BASIC METABOLIC PANEL      Component Value Range   Sodium 133 (*) 135 - 145 mEq/L   Potassium 4.1  3.5 - 5.1 mEq/L   Chloride 100  96 - 112 mEq/L   CO2 25  19 - 32 mEq/L   Glucose, Bld 172 (*) 70 - 99 mg/dL   BUN 12  6 - 23 mg/dL   Creatinine, Ser 0.86   0.50 - 1.35 mg/dL   Calcium 8.3 (*) 8.4 - 10.5 mg/dL   GFR calc non Af Amer >90  >90 mL/min   GFR calc Af Amer >90  >90 mL/min  CBC      Component Value Range   WBC 18.2 (*) 4.0 - 10.5 K/uL   RBC 3.60 (*) 4.22 - 5.81 MIL/uL   Hemoglobin 11.5 (*) 13.0 - 17.0 g/dL   HCT 57.8 (*) 46.9 - 62.9 %   MCV 91.7  78.0 - 100.0 fL   MCH 31.9  26.0 - 34.0 pg   MCHC 34.8  30.0 - 36.0 g/dL   RDW 52.8  41.3 - 24.4 %   Platelets 155  150 - 400 K/uL  BASIC METABOLIC PANEL      Component Value Range   Sodium 136  135 - 145 mEq/L   Potassium 4.2  3.5 - 5.1 mEq/L   Chloride 103  96 - 112 mEq/L   CO2 26  19 - 32 mEq/L   Glucose, Bld 170 (*)  70 - 99 mg/dL   BUN 15  6 - 23 mg/dL   Creatinine, Ser 2.13  0.50 - 1.35 mg/dL   Calcium 8.8  8.4 - 08.6 mg/dL   GFR calc non Af Amer >90  >90 mL/min   GFR calc Af Amer >90  >90 mL/min    Discharge Medications:     Medication List     As of 04/21/2012  9:26 AM    TAKE these medications         aspirin 81 MG tablet   Take 81 mg by mouth daily.      multivitamin tablet   Take 1 tablet by mouth daily.      niacin 500 MG tablet   Commonly known as: SLO-NIACIN   Take 500 mg by mouth at bedtime.      NON FORMULARY   BIPAP MACHINE      pravastatin 40 MG tablet   Commonly known as: PRAVACHOL   Take 40 mg by mouth every evening.      saw palmetto 160 MG capsule   Take 160 mg by mouth every evening.      vardenafil 20 MG tablet   Commonly known as: LEVITRA   Take 1 tablet (20 mg total) by mouth daily as needed.      Vitamin D-3 1000 UNITS Caps   Take by mouth 2 (two) times daily.        Diagnostic Studies: US Soft Tissue Head/neck  03/23/2012  *RADIOLOGY REPORT*  Clinical Data: Multinodular goiter.  THYROID ULTRASOUND  Technique: Ultrasound examination of the thyroid gland and adjacent soft tissues was performed.  Comparison:  03/30/2010  Findings:  Right thyroid lobe:  4.0 x 1.7 x 1.9 cm. Left thyroid lobe:  4.5 x 1.7 x 1.4 cm. Isthmus:  4 mm.   Focal nodules:  Complex nodule in the lower pole measures 1.4 x 1.3 x 1.2 cm.  This compares to 1.8 cm previously.  Previously seen cystic nodule in the left upper pole significantly reduced in size, now measuring 1.1 x 1.1 x 1.0 cm previously compared to 3.2 cm previously.  6 mm nodule in the right mid pole.  Lymphadenopathy:  None visualized.  IMPRESSION: Decreasing size of the dominant left thyroid nodules.   Original Report Authenticated By: Cyndie Chime, M.D.     Disposition: 01-Home or Self Care      Discharge Orders    Future Orders Please Complete By Expires   Diet - low sodium heart healthy      Call MD / Call 911      Comments:   If you experience chest pain or shortness of breath, CALL 911 and be transported to the hospital emergency room.  If you develope a fever above 101 F, pus (Raphael drainage) or increased drainage or redness at the wound, or calf pain, call your surgeon's office.   Constipation Prevention      Comments:   Drink plenty of fluids.  Prune juice may be helpful.  You may use a stool softener, such as Colace (over the counter) 100 mg twice a day.  Use MiraLax (over the counter) for constipation as needed.   Increase activity slowly as tolerated            Signed: Thais Silberstein B 04/21/2012, 9:26 AM

## 2012-04-24 ENCOUNTER — Encounter (HOSPITAL_COMMUNITY): Payer: Self-pay | Admitting: Orthopedic Surgery

## 2012-04-25 LAB — HM DIABETES EYE EXAM

## 2012-05-04 ENCOUNTER — Other Ambulatory Visit: Payer: Self-pay | Admitting: Internal Medicine

## 2012-05-07 NOTE — Telephone Encounter (Signed)
Medication no longer on med list. Spoke with patient, patient verified that he is no longer taking metformin, pharmacy sent in error

## 2012-07-21 ENCOUNTER — Other Ambulatory Visit: Payer: Self-pay

## 2013-03-22 ENCOUNTER — Other Ambulatory Visit: Payer: Self-pay | Admitting: *Deleted

## 2013-03-22 MED ORDER — PRAVASTATIN SODIUM 40 MG PO TABS: 40.0000 mg | ORAL_TABLET | Freq: Every evening | ORAL | Status: DC

## 2013-03-22 NOTE — Telephone Encounter (Signed)
Pravachol refill sent to pharmacy. OV due

## 2013-04-11 ENCOUNTER — Other Ambulatory Visit: Payer: Self-pay

## 2013-04-16 ENCOUNTER — Other Ambulatory Visit (INDEPENDENT_AMBULATORY_CARE_PROVIDER_SITE_OTHER): Payer: No Typology Code available for payment source

## 2013-04-16 DIAGNOSIS — E785 Hyperlipidemia, unspecified: Secondary | ICD-10-CM

## 2013-04-16 DIAGNOSIS — Z0000 Encounter for general adult medical examination without abnormal findings: Secondary | ICD-10-CM

## 2013-04-16 DIAGNOSIS — T887XXA Unspecified adverse effect of drug or medicament, initial encounter: Secondary | ICD-10-CM

## 2013-04-17 LAB — BASIC METABOLIC PANEL
BUN: 21 mg/dL (ref 6–23)
CO2: 28 mEq/L (ref 19–32)
Calcium: 9.3 mg/dL (ref 8.4–10.5)
Glucose, Bld: 93 mg/dL (ref 70–99)
Sodium: 139 mEq/L (ref 135–145)

## 2013-04-17 LAB — HEPATIC FUNCTION PANEL
ALT: 30 U/L (ref 0–53)
AST: 25 U/L (ref 0–37)
Alkaline Phosphatase: 49 U/L (ref 39–117)
Bilirubin, Direct: 0.1 mg/dL (ref 0.0–0.3)
Total Bilirubin: 0.8 mg/dL (ref 0.3–1.2)

## 2013-04-17 LAB — LIPID PANEL
HDL: 46.9 mg/dL (ref >39.00)
Total CHOL/HDL Ratio: 4
Triglycerides: 157 mg/dL — ABNORMAL HIGH (ref 0.0–149.0)

## 2013-04-17 LAB — CBC WITH DIFFERENTIAL/PLATELET
Basophils Absolute: 0 10*3/uL (ref 0.0–0.1)
Eosinophils Relative: 1.8 % (ref 0.0–5.0)
Lymphocytes Relative: 31.6 % (ref 12.0–46.0)
Monocytes Relative: 4.5 % (ref 3.0–12.0)
Neutrophils Relative %: 61.6 % (ref 43.0–77.0)
Platelets: 190 10*3/uL (ref 150.0–400.0)
RDW: 13.5 % (ref 11.5–14.6)
WBC: 6.4 10*3/uL (ref 4.5–10.5)

## 2013-04-17 LAB — LDL CHOLESTEROL, DIRECT: Direct LDL: 137.2 mg/dL

## 2013-04-17 LAB — HEMOGLOBIN A1C: Hgb A1c MFr Bld: 6.3 % (ref 4.6–6.5)

## 2013-04-24 ENCOUNTER — Telehealth: Payer: Self-pay

## 2013-04-24 NOTE — Telephone Encounter (Signed)
Medication List and allergies: reviewed and updated  90 day supply/mail order: na Local prescriptions: [facility]  Immunizations due: thinking about flu vaccine  A/P:   No changes to FH or PSH Tdap--10/2008 [facility]--02/2006--Dr Gessner--due 2017 PSA--03/2011--1.44  To Discuss with Provider: labs

## 2013-04-25 ENCOUNTER — Encounter: Payer: Self-pay | Admitting: Internal Medicine

## 2013-04-25 ENCOUNTER — Ambulatory Visit (INDEPENDENT_AMBULATORY_CARE_PROVIDER_SITE_OTHER): Payer: No Typology Code available for payment source | Admitting: Internal Medicine

## 2013-04-25 VITALS — BP 125/73 | HR 77 | Temp 98.5°F | Resp 12 | Ht 75.25 in | Wt 258.8 lb

## 2013-04-25 DIAGNOSIS — R7309 Other abnormal glucose: Secondary | ICD-10-CM

## 2013-04-25 DIAGNOSIS — E785 Hyperlipidemia, unspecified: Secondary | ICD-10-CM

## 2013-04-25 DIAGNOSIS — Z0000 Encounter for general adult medical examination without abnormal findings: Secondary | ICD-10-CM

## 2013-04-25 MED ORDER — ATORVASTATIN CALCIUM 20 MG PO TABS: 20.0000 mg | ORAL_TABLET | Freq: Every day | ORAL | Status: DC

## 2013-04-25 MED ORDER — METFORMIN HCL 500 MG PO TABS: ORAL_TABLET | ORAL | Status: DC

## 2013-04-25 MED ORDER — VARDENAFIL HCL 20 MG PO TABS: 20.0000 mg | ORAL_TABLET | Freq: Every day | ORAL | Status: DC | PRN

## 2013-04-25 NOTE — Progress Notes (Signed)
Pre visit review using our clinic review tool, if applicable. No additional management support is needed unless otherwise documented below in the visit note. 

## 2013-04-25 NOTE — Progress Notes (Signed)
Subjective:    Patient ID: Kenneth Shaw, male    DOB: Dec 07, 1955, 57 y.o.   MRN: [id number]  HPI  He is here for a physical;acute issues denied.     Review of Systems  A heart healthy, low carb diet is followed; exercise encompasses 45-60 minutes 5 times per week as CVE without symptoms.  Family history is positive for premature coronary disease. With DM risk  LDL goal is less than 100 ; ideally < 70 . There is medication compliance with the statin.  Low dose ASA taken Specifically denied are  chest pain, palpitations, dyspnea, or claudication.  Significant abdominal symptoms, memory deficit, or myalgias not present. FBS 135-170; post meal < 180.Diabetes status assessment: Eye exam due. Foot care not current No excess thirst or excess urination reported.Some excess hunger @ night                         No lightheadedness with standing reported.                   No non healing skin  ulcers or sores of extremities noted. No numbness or tingling or burning in feet described.                                                                                                                                          Change in weight of 30 pound gain post operatively No blurred,double, or loss of vision reported  .         Objective:   Physical Exam   Gen.: Healthy and well-nourished in appearance. Alert, appropriate and cooperative throughout exam.Appears younger than stated age  Head: Normocephalic without obvious abnormalities  Eyes: No corneal or conjunctival inflammation noted. Pupils equal round reactive to light and accommodation. Extraocular motion intact.  Ears: External  ear exam reveals no significant lesions or deformities. Canals clear .TMs normal. Hearing is grossly normal bilaterally. Nose: External nasal exam reveals no deformity or inflammation. Nasal mucosa are pink and moist. No lesions or exudates noted.   Mouth: Oral mucosa and oropharynx reveal no lesions or exudates.  Teeth in good repair. Neck: No deformities, masses, or tenderness noted. Range of motion & Thyroid normal. Lungs: Normal respiratory effort; chest expands symmetrically. Lungs are clear to auscultation without rales, wheezes, or increased work of breathing. Heart: Normal rate and rhythm. Normal S1 and S2. No gallop, click, or rub. S4 w/o murmur. Abdomen: Bowel sounds normal; abdomen soft and nontender. No masses, organomegaly or hernias noted. Genitalia: Genitalia normal except for bilateral varices. Prostate is normal without enlargement, asymmetry, nodularity, or induration                                   Musculoskeletal/extremities: No deformity or scoliosis noted of  the thoracic or lumbar spine.  No clubbing, cyanosis, edema, or significant extremity  deformity noted. Range of motion decreased L knee .Tone & strength normal. Hand joints normal . Fingernail / toenail health good. Able to lie down & sit up w/o help. Negative SLR bilaterally Vascular: Carotid, radial artery, dorsalis pedis and  posterior tibial pulses are full and equal. No bruits present. Neurologic: Alert and oriented x3. Deep tendon reflexes symmetrical and normal.       Skin: Intact without suspicious lesions or rashes. Lymph: No cervical, axillary, or inguinal lymphadenopathy present. Psych: Mood and affect are normal. Normally interactive                                                                                         Assessment & Plan:  #1 comprehensive physical exam; no acute findings  Plan: see Orders  & Recommendations

## 2013-04-25 NOTE — Patient Instructions (Signed)
Annual podiatry and retinal assessments are indicated because the diabetes risk. RTC in 3 &1/2 months for labs.

## 2013-05-06 LAB — HM DIABETES EYE EXAM

## 2013-08-12 ENCOUNTER — Encounter: Payer: Self-pay | Admitting: Internal Medicine

## 2013-08-27 ENCOUNTER — Other Ambulatory Visit (INDEPENDENT_AMBULATORY_CARE_PROVIDER_SITE_OTHER): Payer: BC Managed Care – PPO

## 2013-08-27 DIAGNOSIS — E785 Hyperlipidemia, unspecified: Secondary | ICD-10-CM

## 2013-08-27 DIAGNOSIS — E119 Type 2 diabetes mellitus without complications: Secondary | ICD-10-CM

## 2013-08-27 LAB — LIPID PANEL
Cholesterol: 135 mg/dL (ref 0–200)
HDL: 45.9 mg/dL (ref >39.00)
LDL Cholesterol: 72 mg/dL (ref 0–99)
TRIGLYCERIDES: 86 mg/dL (ref 0.0–149.0)
Total CHOL/HDL Ratio: 3
VLDL: 17.2 mg/dL (ref 0.0–40.0)

## 2013-08-27 LAB — ALT: ALT: 34 U/L (ref 0–53)

## 2013-08-27 LAB — AST: AST: 27 U/L (ref 0–37)

## 2013-08-27 LAB — CREATININE, SERUM: Creatinine, Ser: 1.1 mg/dL (ref 0.4–1.5)

## 2013-08-27 LAB — CK: CK TOTAL: 166 U/L (ref 7–232)

## 2013-08-27 LAB — BUN: BUN: 21 mg/dL (ref 6–23)

## 2013-08-27 LAB — HEMOGLOBIN A1C: Hgb A1c MFr Bld: 6.7 % — ABNORMAL HIGH (ref 4.6–6.5)

## 2013-08-31 ENCOUNTER — Encounter: Payer: Self-pay | Admitting: Internal Medicine

## 2013-09-02 ENCOUNTER — Other Ambulatory Visit: Payer: Self-pay | Admitting: Internal Medicine

## 2013-09-02 DIAGNOSIS — E785 Hyperlipidemia, unspecified: Secondary | ICD-10-CM

## 2013-09-02 DIAGNOSIS — R7309 Other abnormal glucose: Secondary | ICD-10-CM

## 2013-09-02 MED ORDER — METFORMIN HCL 500 MG PO TABS: ORAL_TABLET | ORAL | Status: DC

## 2013-09-02 MED ORDER — ATORVASTATIN CALCIUM 20 MG PO TABS: 20.0000 mg | ORAL_TABLET | Freq: Every day | ORAL | Status: DC

## 2013-09-04 ENCOUNTER — Encounter: Payer: Self-pay | Admitting: Internal Medicine

## 2013-09-05 ENCOUNTER — Telehealth: Payer: Self-pay | Admitting: *Deleted

## 2013-09-05 DIAGNOSIS — R7989 Other specified abnormal findings of blood chemistry: Secondary | ICD-10-CM

## 2013-09-05 NOTE — Telephone Encounter (Signed)
See telephone encounter for (09-05-13).//AB/CMA

## 2013-09-05 NOTE — Telephone Encounter (Signed)
Message copied by Harl Bowie on Thu Sep 05, 2013 10:55 AM ------      Message from: Hendricks Limes      Created: Mon Sep 02, 2013  2:50 PM       Please obtain results of Testosterone level. Thanks, Hopp       ------

## 2013-09-05 NOTE — Telephone Encounter (Signed)
Spoke with the lab and the Testosterone level was not done.  Informed the pt of this and pt agreed to come back and have the test drawn.  Informed the pt he will need to come one morning between 7:30am and 8:00 am fasting.  Pt understood and agreed and stated he will come on Monday morning (09/09/13) to have the test drawn.  Future labs ordered and sent.//AB/CMA

## 2013-09-09 ENCOUNTER — Other Ambulatory Visit (INDEPENDENT_AMBULATORY_CARE_PROVIDER_SITE_OTHER): Payer: BC Managed Care – PPO

## 2013-09-09 DIAGNOSIS — E291 Testicular hypofunction: Secondary | ICD-10-CM

## 2013-09-09 DIAGNOSIS — R7989 Other specified abnormal findings of blood chemistry: Secondary | ICD-10-CM

## 2013-09-09 LAB — TESTOSTERONE: TESTOSTERONE: 338.68 ng/dL — AB (ref 350.00–890.00)

## 2013-09-09 NOTE — Telephone Encounter (Signed)
Patient called and scheduled him a lab appointment for July. Please advise with lab orders. Thanks

## 2013-09-10 ENCOUNTER — Encounter: Payer: Self-pay | Admitting: Internal Medicine

## 2013-09-12 ENCOUNTER — Other Ambulatory Visit: Payer: Self-pay

## 2013-09-16 ENCOUNTER — Ambulatory Visit (INDEPENDENT_AMBULATORY_CARE_PROVIDER_SITE_OTHER): Payer: BC Managed Care – PPO | Admitting: Internal Medicine

## 2013-09-16 ENCOUNTER — Encounter: Payer: Self-pay | Admitting: Internal Medicine

## 2013-09-16 VITALS — BP 130/82 | HR 83 | Temp 99.0°F | Resp 14 | Wt 257.0 lb

## 2013-09-16 DIAGNOSIS — E291 Testicular hypofunction: Secondary | ICD-10-CM

## 2013-09-16 DIAGNOSIS — R7989 Other specified abnormal findings of blood chemistry: Secondary | ICD-10-CM

## 2013-09-16 DIAGNOSIS — E042 Nontoxic multinodular goiter: Secondary | ICD-10-CM

## 2013-09-16 DIAGNOSIS — E119 Type 2 diabetes mellitus without complications: Secondary | ICD-10-CM

## 2013-09-16 NOTE — Patient Instructions (Signed)
The screening Testosterone level is low;this test may be affected by diet & meds.The confirmatory test, fasting  Free Testosterone level, should be drawn. Pharmaceutical companies are aggressively marketing testosterone replacement to men to treat "low T". Clinical symptoms of low testosterone are profound weakness and significantly decreased libido. If  erectile dysfunction  Is not improved with agents such as Viagra, Levitra, or Cialis ; adding testosterone does NOT correct the erectile dysfunction. The major causes of erectile dysfunction are  hypertension, cholesterol & diabetes which are uncontrolled and smoking. A small number of studies sponsored by the pharmaceutical industry show some short term benefits in selected groups of men with low testosterone but there are no long-term safety studies. A study in the [employer]( 2010; 456:256) revealed increased risk of heart attack, stroke, or death in men receiving testosterone replacement if they have associated cardiovascular risk factors. Cardiovascular factors include smoking, hypertension, diabetes, and elevated cholesterol. We simply do not know the risk of long term hormonal therapy in men. Of concern is that hormonal ( estrogen ) replacement in women  was associated with increased cardiovascular risk if cardiovascular risks were present as docummentred in the long term  Women's Health Initiative study.Estrogen replacement ,which had been the "standard" of care, has become the exception with very strict treatment criteria. The screening Testosterone  test may be affected by diet & other medications & supplements.The confirmatory test is a fasting  Free Testosterone which is unaffected by these factors. As preventive healthcare; the most important intervention is to address reversible risk factors  described above. If erectile dysfunction is the real issue ; stopping smoking and control of blood pressure,glucose & cholesterol is  the solution , not testosterone replacement.  If free testosterone levels are truly low and symptoms of muscle weakness and libido are present; supplementation could be considered. Erectile agents do not address those symptoms. Critical would be to actively  address the risk factors  rather than simply taking testosterone replacement with its associated potential risks.  I would be happy to discuss these issues in person @ an office appointment or I could refer you to an Endocrinologist or Urologist for a second opinion  if desired . My concern is your long term health & prevention of serious complications of unaddressed risk factors.

## 2013-09-16 NOTE — Assessment & Plan Note (Signed)
TSH 

## 2013-09-16 NOTE — Progress Notes (Signed)
   Subjective:    Patient ID: Kenneth Shaw, male    DOB: August 04, 1955, 58 y.o.   MRN: [id number]  HPI  Here to discuss low testosterone. Feels that he has low energy, despite working out 5 days/week. States he doesn't build muscle well.    Review of Systems Constitutional: Negative for fever, chills, activity change and appetite change.  Musculoskeletal: Arthralgias: believes statins make his leg muscles ache.  Difficulty losing weight  Fasting CBG - between 124-136. Post-prandial 125-150. Avoiding [place] carbs and refined sugars since last A1c.Previously he was eating " junk " to excess. Works out 5 days/per week-weights and cardio.  Using levitra for ED but this costs $43 / pill.      Objective:   Physical Exam  Constitutional: He is oriented to person, place, and time. He appears well-developed and well-nourished. No distress.  HENT:  Head: Normocephalic and atraumatic.  Eyes: Pupils are equal, round, and reactive to light. No scleral icterus.  Neck: Neck supple. No thyromegaly or nodules present.  Cardiovascular: Normal rate, regular rhythm, normal heart sounds and intact distal pulses.  Pulmonary/Chest: Effort normal and breath sounds normal. No respiratory distress.  Abdominal: Soft. Bowel sounds are normal. He exhibits no distension and no mass. There is no tenderness.  Musculoskeletal: Normal range of motion.  Lymphadenopathy:  He has no cervical adenopathy (no axillary adenopathy).  Neurological: He is alert and oriented to person, place, and time. He has normal reflexes.  Skin: Skin is warm and damp          Assessment & Plan:  See Current Assessment & Plan in Problem List under specific Diagnosis

## 2013-09-16 NOTE — Assessment & Plan Note (Addendum)
A1c rose from 6.3 %  On 04/16/14 to 6.7% on 08/27/13 Urine microalbumin Options discussed He wants to pursue TLC & Metformin bid A1c in 7/15

## 2013-09-16 NOTE — Assessment & Plan Note (Addendum)
Plan:Free testosterone;PSA Indications for testosterone screening are: profound loss of libido; profound muscle weakness; or osteopenia( bone thinning). Testosterone does not treat erectile dysfunction; adding testosterone to an agent such as Viagra, Cialis , or Levitra is no more beneficial than the erectile medication alone. There is some controversy about increased cardiovascular risk with testosterone supplementation. Certainly extreme caution is recommended if there is a family or personal history of prostate cancer. The confirmatory test, fasting  Free Testosterone level, is not  affected by diet & meds.  If indicated & you wish to pursue testosterone replacement; please verify the brand covered by your insurance plan. The PSA should be monitored annually if you start testosterone supplementation.

## 2013-09-16 NOTE — Progress Notes (Signed)
   Subjective:    Patient ID: Kenneth Shaw, male    DOB: December 13, 1955, 58 y.o.   MRN: [id number]  HPI Here to discuss low testosterone. Feels that he has low energy, despite working out 5 days/week. States he doesn't build muscle well.    Review of Systems  Constitutional: Negative for fever, chills, activity change and appetite change.  Musculoskeletal: Arthralgias: believes statins make his leg muscles ache.   Fasting CBG - between 124-136. Post-prandial 125-150. Avoiding [place] carbs and refined sugars. Works out 5 days/per week-weights and cardio. Using levitra for ED.     Objective:   Physical Exam  Constitutional: He is oriented to person, place, and time. He appears well-developed and well-nourished. No distress.  HENT:  Head: Normocephalic and atraumatic.  Eyes: Pupils are equal, round, and reactive to light. No scleral icterus.  Neck: Neck supple. No thyromegaly present.  Cardiovascular: Normal rate, regular rhythm, normal heart sounds and intact distal pulses.   Pulmonary/Chest: Effort normal and breath sounds normal. No respiratory distress.  Abdominal: Soft. Bowel sounds are normal. He exhibits no distension and no mass. There is no tenderness.  Musculoskeletal: Normal range of motion.  Lymphadenopathy:    He has no cervical adenopathy (no axillary adenopathy).  Neurological: He is alert and oriented to person, place, and time. He has normal reflexes.  Skin: Skin is warm and dry.          Assessment & Plan:

## 2013-09-18 ENCOUNTER — Other Ambulatory Visit (INDEPENDENT_AMBULATORY_CARE_PROVIDER_SITE_OTHER): Payer: BC Managed Care – PPO

## 2013-09-18 DIAGNOSIS — R7989 Other specified abnormal findings of blood chemistry: Secondary | ICD-10-CM

## 2013-09-18 DIAGNOSIS — E119 Type 2 diabetes mellitus without complications: Secondary | ICD-10-CM

## 2013-09-18 DIAGNOSIS — E042 Nontoxic multinodular goiter: Secondary | ICD-10-CM

## 2013-09-18 DIAGNOSIS — E291 Testicular hypofunction: Secondary | ICD-10-CM

## 2013-09-18 LAB — MICROALBUMIN / CREATININE URINE RATIO
Creatinine,U: 255.2 mg/dL
Microalb Creat Ratio: 0.4 mg/g (ref 0.0–30.0)
Microalb, Ur: 1 mg/dL (ref 0.0–1.9)

## 2013-09-18 LAB — PSA: PSA: 2.77 ng/mL (ref 0.10–4.00)

## 2013-09-18 LAB — TSH: TSH: 4.46 u[IU]/mL (ref 0.35–5.50)

## 2013-09-19 LAB — TESTOSTERONE, FREE, TOTAL, SHBG
SEX HORMONE BINDING: 47 nmol/L (ref 13–71)
TESTOSTERONE-% FREE: 1.7 % (ref 1.6–2.9)
TESTOSTERONE: 495 ng/dL (ref 300–890)
Testosterone, Free: 82.5 pg/mL (ref 47.0–244.0)

## 2013-09-30 ENCOUNTER — Telehealth: Payer: Self-pay

## 2013-09-30 NOTE — Telephone Encounter (Signed)
Relevant patient education assigned to patient using Emmi. ° °

## 2013-12-13 ENCOUNTER — Telehealth: Payer: Self-pay | Admitting: Internal Medicine

## 2013-12-13 DIAGNOSIS — E782 Mixed hyperlipidemia: Secondary | ICD-10-CM

## 2013-12-13 DIAGNOSIS — E119 Type 2 diabetes mellitus without complications: Secondary | ICD-10-CM

## 2013-12-13 NOTE — Telephone Encounter (Signed)
Patient is coming in to have his A1c rechecked and a lipid profile. Needs orders.

## 2013-12-13 NOTE — Telephone Encounter (Signed)
Orders placed.

## 2013-12-17 ENCOUNTER — Other Ambulatory Visit: Payer: BC Managed Care – PPO

## 2013-12-17 ENCOUNTER — Other Ambulatory Visit (INDEPENDENT_AMBULATORY_CARE_PROVIDER_SITE_OTHER): Payer: BC Managed Care – PPO

## 2013-12-17 DIAGNOSIS — E119 Type 2 diabetes mellitus without complications: Secondary | ICD-10-CM

## 2013-12-17 DIAGNOSIS — E782 Mixed hyperlipidemia: Secondary | ICD-10-CM

## 2013-12-17 LAB — LIPID PANEL
Cholesterol: 118 mg/dL (ref 0–200)
HDL: 42.5 mg/dL (ref >39.00)
LDL CALC: 62 mg/dL (ref 0–99)
NONHDL: 75.5
Total CHOL/HDL Ratio: 3
Triglycerides: 70 mg/dL (ref 0.0–149.0)
VLDL: 14 mg/dL (ref 0.0–40.0)

## 2013-12-17 LAB — HEMOGLOBIN A1C: HEMOGLOBIN A1C: 6.3 % (ref 4.6–6.5)

## 2014-03-14 ENCOUNTER — Other Ambulatory Visit: Payer: Self-pay | Admitting: Internal Medicine

## 2014-03-26 ENCOUNTER — Telehealth: Payer: Self-pay | Admitting: Internal Medicine

## 2014-03-26 ENCOUNTER — Other Ambulatory Visit: Payer: Self-pay | Admitting: Internal Medicine

## 2014-03-26 DIAGNOSIS — Z0000 Encounter for general adult medical examination without abnormal findings: Secondary | ICD-10-CM

## 2014-03-26 NOTE — Telephone Encounter (Signed)
Patient has scheduled his CPE for the last week of November.  He is requesting labs to be entered before then to get done.

## 2014-04-01 ENCOUNTER — Encounter: Payer: Self-pay | Admitting: Internal Medicine

## 2014-04-23 ENCOUNTER — Other Ambulatory Visit (INDEPENDENT_AMBULATORY_CARE_PROVIDER_SITE_OTHER): Payer: BC Managed Care – PPO

## 2014-04-23 DIAGNOSIS — Z0000 Encounter for general adult medical examination without abnormal findings: Secondary | ICD-10-CM

## 2014-04-23 LAB — LIPID PANEL
Cholesterol: 139 mg/dL (ref 0–200)
HDL: 48.3 mg/dL (ref >39.00)
LDL Cholesterol: 69 mg/dL (ref 0–99)
NonHDL: 90.7
Total CHOL/HDL Ratio: 3
Triglycerides: 108 mg/dL (ref 0.0–149.0)
VLDL: 21.6 mg/dL (ref 0.0–40.0)

## 2014-04-23 LAB — CBC WITH DIFFERENTIAL/PLATELET
BASOS ABS: 0 10*3/uL (ref 0.0–0.1)
Basophils Relative: 0.3 % (ref 0.0–3.0)
Eosinophils Absolute: 0.1 10*3/uL (ref 0.0–0.7)
Eosinophils Relative: 1.3 % (ref 0.0–5.0)
HEMATOCRIT: 47.3 % (ref 39.0–52.0)
Hemoglobin: 15.9 g/dL (ref 13.0–17.0)
Lymphocytes Relative: 24 % (ref 12.0–46.0)
Lymphs Abs: 1.8 10*3/uL (ref 0.7–4.0)
MCHC: 33.5 g/dL (ref 30.0–36.0)
MCV: 93.4 fl (ref 78.0–100.0)
MONO ABS: 0.5 10*3/uL (ref 0.1–1.0)
Monocytes Relative: 6.4 % (ref 3.0–12.0)
NEUTROS PCT: 68 % (ref 43.0–77.0)
Neutro Abs: 5.1 10*3/uL (ref 1.4–7.7)
PLATELETS: 217 10*3/uL (ref 150.0–400.0)
RBC: 5.07 Mil/uL (ref 4.22–5.81)
RDW: 13.1 % (ref 11.5–15.5)
WBC: 7.5 10*3/uL (ref 4.0–10.5)

## 2014-04-23 LAB — BASIC METABOLIC PANEL
BUN: 21 mg/dL (ref 6–23)
CO2: 25 mEq/L (ref 19–32)
CREATININE: 1.2 mg/dL (ref 0.4–1.5)
Calcium: 9.4 mg/dL (ref 8.4–10.5)
Chloride: 107 mEq/L (ref 96–112)
GFR: 69.12 mL/min (ref >60.00)
GLUCOSE: 121 mg/dL — AB (ref 70–99)
POTASSIUM: 4.1 meq/L (ref 3.5–5.1)
Sodium: 142 mEq/L (ref 135–145)

## 2014-04-23 LAB — HEPATIC FUNCTION PANEL
ALBUMIN: 4.7 g/dL (ref 3.5–5.2)
ALT: 28 U/L (ref 0–53)
AST: 25 U/L (ref 0–37)
Alkaline Phosphatase: 53 U/L (ref 39–117)
BILIRUBIN TOTAL: 1 mg/dL (ref 0.2–1.2)
Bilirubin, Direct: 0.1 mg/dL (ref 0.0–0.3)
Total Protein: 7.2 g/dL (ref 6.0–8.3)

## 2014-04-23 LAB — MICROALBUMIN / CREATININE URINE RATIO
Creatinine,U: 309.1 mg/dL
MICROALB/CREAT RATIO: 0.4 mg/g (ref 0.0–30.0)
Microalb, Ur: 1.3 mg/dL (ref 0.0–1.9)

## 2014-04-23 LAB — TSH: TSH: 5.64 u[IU]/mL — ABNORMAL HIGH (ref 0.35–4.50)

## 2014-04-23 LAB — HEMOGLOBIN A1C: HEMOGLOBIN A1C: 6.2 % (ref 4.6–6.5)

## 2014-04-28 ENCOUNTER — Encounter: Payer: Self-pay | Admitting: Internal Medicine

## 2014-04-28 ENCOUNTER — Ambulatory Visit (INDEPENDENT_AMBULATORY_CARE_PROVIDER_SITE_OTHER): Payer: BC Managed Care – PPO | Admitting: Internal Medicine

## 2014-04-28 ENCOUNTER — Other Ambulatory Visit (INDEPENDENT_AMBULATORY_CARE_PROVIDER_SITE_OTHER): Payer: BC Managed Care – PPO

## 2014-04-28 VITALS — BP 130/80 | HR 72 | Temp 97.8°F | Resp 12 | Ht 75.5 in | Wt 254.5 lb

## 2014-04-28 DIAGNOSIS — E782 Mixed hyperlipidemia: Secondary | ICD-10-CM

## 2014-04-28 DIAGNOSIS — N429 Disorder of prostate, unspecified: Secondary | ICD-10-CM | POA: Insufficient documentation

## 2014-04-28 DIAGNOSIS — Z0189 Encounter for other specified special examinations: Secondary | ICD-10-CM

## 2014-04-28 DIAGNOSIS — Z0000 Encounter for general adult medical examination without abnormal findings: Secondary | ICD-10-CM

## 2014-04-28 DIAGNOSIS — M720 Palmar fascial fibromatosis [Dupuytren]: Secondary | ICD-10-CM

## 2014-04-28 DIAGNOSIS — R7989 Other specified abnormal findings of blood chemistry: Secondary | ICD-10-CM

## 2014-04-28 LAB — T3, FREE: T3, Free: 3.3 pg/mL (ref 2.3–4.2)

## 2014-04-28 LAB — TSH: TSH: 3.51 u[IU]/mL (ref 0.35–4.50)

## 2014-04-28 LAB — PSA: PSA: 2.28 ng/mL (ref 0.10–4.00)

## 2014-04-28 LAB — T4, FREE: FREE T4: 1.01 ng/dL (ref 0.60–1.60)

## 2014-04-28 NOTE — Patient Instructions (Signed)
Your next office appointment will be determined based upon review of your pending labs. Those instructions will be transmitted to you through My Chart . 

## 2014-04-28 NOTE — Progress Notes (Signed)
   Subjective:    Patient ID: Kenneth Shaw, male    DOB: [birth year], 58 y.o.   MRN: [id number]  HPI  He is here for a physical;acute issues denied.  He is on a heart healthy diet and exercises at a high level. Specifically he exercises 1.5 hours 5 days a week without associated cardiopulmonary symptoms  He is compliant with his medicines without  adverse effect . He does have some right shoulder pain but no myalgias. He also denies any significant memory issues.  His colonoscopy is up-to-date; he had diverticulosis but no polyps.    FBS 109-125; no hypoglycemia. He stays hungry & describes frequency & nocturia 1-4 X. OPhth exam due 12/15; no prior retinopathy.   Review of Systems   Chest pain, palpitations, tachycardia, exertional dyspnea, paroxysmal nocturnal dyspnea, claudication or edema are absent.       Objective:   Physical Exam Gen.: Healthy and well-nourished in appearance. Alert, appropriate and cooperative throughout exam. Appears younger than stated age  Head: Normocephalic without obvious abnormalities; pattern alopecia . Moustache. Eyes: No corneal or conjunctival inflammation noted. Pupils equal round reactive to light and accommodation. Extraocular motion intact. Fundal exam is benign without hemorrhages, exudate, papilledema.  Vision grossly normal with /w/o lenses Ears: External  ear exam reveals no significant lesions or deformities. Canals clear .TMs normal. Hearing is grossly normal bilaterally. Nose: External nasal exam reveals no deformity or inflammation. Nasal mucosa are pink and moist. No lesions or exudates noted.   Mouth: Oral mucosa and oropharynx reveal no lesions or exudates. Teeth in good repair. Neck: No deformities, masses, or tenderness noted. Range of motion & Thyroid normal w/o nodules Lungs: Normal respiratory effort; chest expands symmetrically. Lungs are clear to auscultation without rales, wheezes, or increased work of breathing. Heart: Normal  rate and rhythm. Normal S1 and S2. No gallop, click, or rub. No murmur. Abdomen: Bowel sounds normal; abdomen soft and nontender. No masses, organomegaly or hernias noted. Genitalia: Genitalia normal except for left varices. Prostate is slightly asymmetrically enlarged w/o nodularity or induration                  Musculoskeletal/extremities: No deformity or scoliosis noted of  the thoracic or lumbar spine.  Hand joints normal. Bilateral Dupuytren's contractures.  Fingernail / toenail health good. Able to lie down & sit up w/o help. Negative SLR bilaterally Vascular: Carotid, radial artery, dorsalis pedis and  posterior tibial pulses are full and equal. No bruits present. Neurologic: Alert and oriented x3. Deep tendon reflexes symmetrical and normal.  Gait normal .       Skin: Intact without suspicious lesions or rashes. Lymph: No cervical, axillary, or inguinal lymphadenopathy present. Psych: Mood and affect are normal. Normally interactive                                                                                        Assessment & Plan:  #1 comprehensive physical exam; no acute findings #2 mild abnormality of prostate #3 elevated TSH  Plan: see Orders  & Recommendations

## 2014-04-28 NOTE — Progress Notes (Signed)
Pre visit review using our clinic review tool, if applicable. No additional management support is needed unless otherwise documented below in the visit note. 

## 2014-06-14 ENCOUNTER — Other Ambulatory Visit: Payer: Self-pay | Admitting: Internal Medicine

## 2014-08-06 ENCOUNTER — Other Ambulatory Visit: Payer: Self-pay

## 2014-08-06 MED ORDER — VARDENAFIL HCL 20 MG PO TABS: 20.0000 mg | ORAL_TABLET | Freq: Every day | ORAL | Status: DC | PRN

## 2014-08-06 NOTE — Telephone Encounter (Signed)
OK as written

## 2014-09-14 ENCOUNTER — Other Ambulatory Visit: Payer: Self-pay | Admitting: Internal Medicine

## 2014-12-12 ENCOUNTER — Other Ambulatory Visit: Payer: Self-pay | Admitting: Internal Medicine

## 2015-01-16 ENCOUNTER — Other Ambulatory Visit: Payer: Self-pay | Admitting: Internal Medicine

## 2015-03-02 ENCOUNTER — Encounter: Payer: Self-pay | Admitting: Internal Medicine

## 2015-03-02 LAB — HM DIABETES EYE EXAM

## 2015-03-09 ENCOUNTER — Encounter: Payer: Self-pay | Admitting: Internal Medicine

## 2015-03-09 ENCOUNTER — Other Ambulatory Visit: Payer: Self-pay | Admitting: Internal Medicine

## 2015-03-11 ENCOUNTER — Other Ambulatory Visit: Payer: Self-pay | Admitting: Internal Medicine

## 2015-03-11 DIAGNOSIS — Z0000 Encounter for general adult medical examination without abnormal findings: Secondary | ICD-10-CM

## 2015-03-11 NOTE — Telephone Encounter (Signed)
Please advise 

## 2015-03-31 ENCOUNTER — Other Ambulatory Visit (INDEPENDENT_AMBULATORY_CARE_PROVIDER_SITE_OTHER): Payer: BLUE CROSS/BLUE SHIELD

## 2015-03-31 DIAGNOSIS — Z0189 Encounter for other specified special examinations: Secondary | ICD-10-CM | POA: Diagnosis not present

## 2015-03-31 DIAGNOSIS — Z0000 Encounter for general adult medical examination without abnormal findings: Secondary | ICD-10-CM

## 2015-03-31 LAB — BASIC METABOLIC PANEL
BUN: 15 mg/dL (ref 6–23)
CALCIUM: 9.5 mg/dL (ref 8.4–10.5)
CO2: 27 meq/L (ref 19–32)
CREATININE: 0.96 mg/dL (ref 0.40–1.50)
Chloride: 106 mEq/L (ref 96–112)
GFR: 84.87 mL/min (ref >60.00)
Glucose, Bld: 122 mg/dL — ABNORMAL HIGH (ref 70–99)
Potassium: 4.4 mEq/L (ref 3.5–5.1)
SODIUM: 141 meq/L (ref 135–145)

## 2015-03-31 LAB — HEPATIC FUNCTION PANEL
ALT: 25 U/L (ref 0–53)
AST: 22 U/L (ref 0–37)
Albumin: 4.3 g/dL (ref 3.5–5.2)
Alkaline Phosphatase: 56 U/L (ref 39–117)
BILIRUBIN DIRECT: 0.1 mg/dL (ref 0.0–0.3)
BILIRUBIN TOTAL: 0.7 mg/dL (ref 0.2–1.2)
Total Protein: 6.8 g/dL (ref 6.0–8.3)

## 2015-03-31 LAB — MICROALBUMIN / CREATININE URINE RATIO
CREATININE, U: 87.5 mg/dL
Microalb Creat Ratio: 0.8 mg/g (ref 0.0–30.0)

## 2015-03-31 LAB — HEMOGLOBIN A1C: HEMOGLOBIN A1C: 6.3 % (ref 4.6–6.5)

## 2015-03-31 LAB — CBC WITH DIFFERENTIAL/PLATELET
BASOS ABS: 0 10*3/uL (ref 0.0–0.1)
BASOS PCT: 0.6 % (ref 0.0–3.0)
EOS ABS: 0.1 10*3/uL (ref 0.0–0.7)
Eosinophils Relative: 1.5 % (ref 0.0–5.0)
HEMATOCRIT: 44.1 % (ref 39.0–52.0)
HEMOGLOBIN: 14.8 g/dL (ref 13.0–17.0)
LYMPHS ABS: 1.8 10*3/uL (ref 0.7–4.0)
Lymphocytes Relative: 26.3 % (ref 12.0–46.0)
MCHC: 33.6 g/dL (ref 30.0–36.0)
MCV: 92.3 fl (ref 78.0–100.0)
MONO ABS: 0.4 10*3/uL (ref 0.1–1.0)
Monocytes Relative: 6 % (ref 3.0–12.0)
NEUTROS ABS: 4.5 10*3/uL (ref 1.4–7.7)
Neutrophils Relative %: 65.6 % (ref 43.0–77.0)
PLATELETS: 222 10*3/uL (ref 150.0–400.0)
RBC: 4.78 Mil/uL (ref 4.22–5.81)
RDW: 13.4 % (ref 11.5–15.5)
WBC: 6.9 10*3/uL (ref 4.0–10.5)

## 2015-03-31 LAB — LIPID PANEL
CHOL/HDL RATIO: 3
CHOLESTEROL: 123 mg/dL (ref 0–200)
HDL: 43 mg/dL (ref >39.00)
LDL CALC: 61 mg/dL (ref 0–99)
NonHDL: 79.66
TRIGLYCERIDES: 93 mg/dL (ref 0.0–149.0)
VLDL: 18.6 mg/dL (ref 0.0–40.0)

## 2015-03-31 LAB — TSH: TSH: 3.33 u[IU]/mL (ref 0.35–4.50)

## 2015-04-07 ENCOUNTER — Other Ambulatory Visit: Payer: BLUE CROSS/BLUE SHIELD

## 2015-04-07 ENCOUNTER — Encounter: Payer: Self-pay | Admitting: Internal Medicine

## 2015-04-07 ENCOUNTER — Ambulatory Visit (INDEPENDENT_AMBULATORY_CARE_PROVIDER_SITE_OTHER): Payer: BLUE CROSS/BLUE SHIELD | Admitting: Internal Medicine

## 2015-04-07 VITALS — BP 132/88 | HR 73 | Temp 97.8°F | Resp 15 | Ht 75.5 in | Wt 255.0 lb

## 2015-04-07 DIAGNOSIS — Z9989 Dependence on other enabling machines and devices: Secondary | ICD-10-CM

## 2015-04-07 DIAGNOSIS — G4733 Obstructive sleep apnea (adult) (pediatric): Secondary | ICD-10-CM

## 2015-04-07 DIAGNOSIS — Z0000 Encounter for general adult medical examination without abnormal findings: Secondary | ICD-10-CM

## 2015-04-07 DIAGNOSIS — Z1159 Encounter for screening for other viral diseases: Secondary | ICD-10-CM

## 2015-04-07 DIAGNOSIS — M720 Palmar fascial fibromatosis [Dupuytren]: Secondary | ICD-10-CM | POA: Diagnosis not present

## 2015-04-07 LAB — HEPATITIS C ANTIBODY: HCV Ab: NEGATIVE

## 2015-04-07 MED ORDER — METFORMIN HCL 500 MG PO TABS: 500.0000 mg | ORAL_TABLET | Freq: Every day | ORAL | Status: DC

## 2015-04-07 MED ORDER — SILDENAFIL CITRATE 20 MG PO TABS: ORAL_TABLET | ORAL | Status: DC

## 2015-04-07 MED ORDER — ATORVASTATIN CALCIUM 20 MG PO TABS: 20.0000 mg | ORAL_TABLET | Freq: Every day | ORAL | Status: DC

## 2015-04-07 NOTE — Patient Instructions (Signed)
Alternatively cheaper therapeutically equivalent option for Levitra is available from  [facility] at [phone].

## 2015-04-07 NOTE — Progress Notes (Signed)
Pre visit review using our clinic review tool, if applicable. No additional management support is needed unless otherwise documented below in the visit note. 

## 2015-04-07 NOTE — Progress Notes (Signed)
   Subjective:    Patient ID: Kenneth Shaw, male    DOB: [birth year], 59 y.o.   MRN: [id number]  HPI  The patient is here for a physical to assess status of active health conditions.  PMH, FH, & Social History reviewed & updated.No change in [place] as recorded.  Compliant with  medications without adverse effects. Excess intake of  fried foods or salt denied. He does eat red meat on a regular basis. Exercise consists of treadmill, elliptical, and weights for at least an hour 5 days a week without associated cardio pulmonary symptoms.  Colonoscopy is up-to-date; no GI symptoms present.  On the elliptical he can have burning in his feet. This resolves when he stops the exercise. He has no other numbness, tingling, burning in the extremities. He has no nonhealing skin lesions. His average sugar is 125. He denies any hypoglycemia. Ophthalmologic exam is up-to-date. Has no retinopathy.  He does have nocturia on average 2-3 times per night.  He's questioning assessment for new CPAP machine for his obstructive sleep apnea. He feels he is doing well but has not had an assessment for several years.  He also questions need for reassessment of status of his Dupuytren's contractures .  Review of Systems  Chest pain, palpitations, tachycardia, exertional dyspnea, paroxysmal nocturnal dyspnea, claudication or edema are absent. No unexplained weight loss, abdominal pain, significant dyspepsia, dysphagia, melena, rectal bleeding, or persistently small caliber stools. Dysuria, pyuria, hematuria, frequency,  or polyuria are denied. Change in hair, skin, nails denied. No bowel changes of constipation or diarrhea. No intolerance to heat or cold.     Objective:   Physical Exam  Pertinent or positive findings include: He has a mustache. Left knee reflex is 0+. Crepitus is present in the knees, left greater than right. He has Dupuytren's contractures present bilaterally. Genitalia normal except for  varices in  left scrotum.. Rectal tone is normal. The prostate is normal in size; there is no induration or nodule present.    General appearance :adequately nourished; in no distress.  Eyes: No conjunctival inflammation or scleral icterus is present.  Oral exam:  Lips and gums are healthy appearing.There is no oropharyngeal erythema or exudate noted. Dental hygiene is good.  Heart:  Normal rate and regular rhythm. S1 and S2 normal without gallop, murmur, click, rub or other extra sounds    Lungs:Chest clear to auscultation; no wheezes, rhonchi,rales ,or rubs present.No increased work of breathing.   Abdomen: bowel sounds normal, soft and non-tender without masses, organomegaly or hernias noted.  No guarding or rebound.   Vascular : all pulses equal ; no bruits present.  Skin:Warm & dry.  Intact without suspicious lesions or rashes ; no tenting or jaundice   Lymphatic: No lymphadenopathy is noted about the head, neck, axilla, or inguinal areas.   Neuro: Strength, tone & DTRs normal.     Assessment & Plan:  #1 comprehensive physical exam; no acute findings  Plan: see Orders  & Recommendations

## 2015-05-28 ENCOUNTER — Telehealth: Payer: Self-pay

## 2015-05-28 NOTE — Telephone Encounter (Signed)
Patient wants to think about getting flu vaccine and may decide to come in later this month

## 2015-06-03 ENCOUNTER — Encounter: Payer: Self-pay | Admitting: Internal Medicine

## 2015-06-03 ENCOUNTER — Other Ambulatory Visit: Payer: Self-pay | Admitting: Emergency Medicine

## 2015-06-03 MED ORDER — METFORMIN HCL 500 MG PO TABS: 500.0000 mg | ORAL_TABLET | Freq: Two times a day (BID) | ORAL | Status: DC

## 2015-09-04 ENCOUNTER — Telehealth: Payer: Self-pay

## 2015-09-04 DIAGNOSIS — Z0000 Encounter for general adult medical examination without abnormal findings: Secondary | ICD-10-CM

## 2015-09-04 NOTE — Telephone Encounter (Signed)
Spoke to pt and pt declined the flu vaccine.   

## 2015-09-04 NOTE — Telephone Encounter (Signed)
Pt wanted to know if he could if he could have lab orders/draw done before November appt with you.

## 2015-09-06 NOTE — Telephone Encounter (Signed)
I will place the orders, but he may wish to check with his insurance company to ensure they will pay for it prior to the appointment.

## 2015-09-07 ENCOUNTER — Encounter: Payer: Self-pay | Admitting: Family

## 2015-09-07 NOTE — Telephone Encounter (Signed)
Pt responded to [employer] and is verifying coverage with insurance company.

## 2015-09-07 NOTE — Telephone Encounter (Signed)
Sent pt a my chart message regarding below.

## 2015-12-21 ENCOUNTER — Encounter: Payer: Self-pay | Admitting: Internal Medicine

## 2016-01-22 ENCOUNTER — Encounter: Payer: Self-pay | Admitting: Family

## 2016-01-22 DIAGNOSIS — E119 Type 2 diabetes mellitus without complications: Secondary | ICD-10-CM

## 2016-03-08 ENCOUNTER — Encounter: Payer: Self-pay | Admitting: Family

## 2016-03-14 ENCOUNTER — Ambulatory Visit (AMBULATORY_SURGERY_CENTER): Payer: Self-pay | Admitting: *Deleted

## 2016-03-14 VITALS — Ht 75.0 in | Wt 215.0 lb

## 2016-03-14 DIAGNOSIS — Z1211 Encounter for screening for malignant neoplasm of colon: Secondary | ICD-10-CM

## 2016-03-14 NOTE — Progress Notes (Signed)
No egg or soy allergy. No anesthesia problems.  No home O2.  No diet meds.  

## 2016-03-15 ENCOUNTER — Encounter: Payer: Self-pay | Admitting: Internal Medicine

## 2016-03-16 ENCOUNTER — Encounter: Payer: Self-pay | Admitting: Family

## 2016-03-22 ENCOUNTER — Other Ambulatory Visit: Payer: Self-pay

## 2016-03-22 ENCOUNTER — Telehealth: Payer: Self-pay

## 2016-03-22 DIAGNOSIS — Z0000 Encounter for general adult medical examination without abnormal findings: Secondary | ICD-10-CM

## 2016-03-22 NOTE — Telephone Encounter (Signed)
I will be seeing you for the first time Nov. 1. Kenneth Shaw has been my doctor in the past. My labs are scheduled. I usually have a hepatic function panel annually and I do not see one scheduled. Also, periodically, Kenneth Shaw checked my testosterone and free test. Last time that was checked was 09/18/2013. Can we check testosterone this time. I have lost 60 pounds since my last visit and am expecting good results from all my tests.   Also, I am having a colonoscopy on October 23.   Kenneth Shaw    This was a message sent through [facility] can we place these labs before his appointment on nov 1st.   Thanks

## 2016-03-22 NOTE — Telephone Encounter (Signed)
Orders for lab work placed

## 2016-03-28 ENCOUNTER — Encounter: Payer: Self-pay | Admitting: Internal Medicine

## 2016-03-28 ENCOUNTER — Ambulatory Visit (AMBULATORY_SURGERY_CENTER): Payer: BLUE CROSS/BLUE SHIELD | Admitting: Internal Medicine

## 2016-03-28 VITALS — BP 114/71 | HR 58 | Temp 98.4°F | Resp 18 | Ht 75.0 in | Wt 215.0 lb

## 2016-03-28 DIAGNOSIS — Z1212 Encounter for screening for malignant neoplasm of rectum: Secondary | ICD-10-CM | POA: Diagnosis not present

## 2016-03-28 DIAGNOSIS — Z1211 Encounter for screening for malignant neoplasm of colon: Secondary | ICD-10-CM | POA: Diagnosis not present

## 2016-03-28 MED ORDER — SODIUM CHLORIDE 0.9 % IV SOLN: 500.0000 mL | INTRAVENOUS | Status: DC

## 2016-03-28 NOTE — Patient Instructions (Addendum)
   No polyps today.  Next routine colonoscopy in 10 years - 2027  I appreciate the opportunity to care for you. Gatha Mayer, MD, FACG  YOU HAD AN ENDOSCOPIC PROCEDURE TODAY AT [facility]:   Refer to the procedure report that was given to you for any specific questions about what was found during the examination.  If the procedure report does not answer your questions, please call your gastroenterologist to clarify.  If you requested that your care partner not be given the details of your procedure findings, then the procedure report has been included in a sealed envelope for you to review at your convenience later.  YOU SHOULD EXPECT: Some feelings of bloating in the abdomen. Passage of more gas than usual.  Walking can help get rid of the air that was put into your GI tract during the procedure and reduce the bloating. If you had a lower endoscopy (such as a colonoscopy or flexible sigmoidoscopy) you may notice spotting of blood in your stool or on the toilet paper. If you underwent a bowel prep for your procedure, you may not have a normal bowel movement for a few days.  Please Note:  You might notice some irritation and congestion in your nose or some drainage.  This is from the oxygen used during your procedure.  There is no need for concern and it should clear up in a day or so.  SYMPTOMS TO REPORT IMMEDIATELY:   Following lower endoscopy (colonoscopy or flexible sigmoidoscopy):  Excessive amounts of blood in the stool  Significant tenderness or worsening of abdominal pains  Swelling of the abdomen that is new, acute  Fever of 100F or higher   For urgent or emergent issues, a gastroenterologist can be reached at any hour by calling [phone].   DIET:  We do recommend a small meal at first, but then you may proceed to your regular diet.  Drink plenty of fluids but you should avoid alcoholic beverages for 24 hours.  ACTIVITY:  You should plan to take  it easy for the rest of today and you should NOT DRIVE or use heavy machinery until tomorrow (because of the sedation medicines used during the test).    FOLLOW UP: Our staff will call the number listed on your records the next business day following your procedure to check on you and address any questions or concerns that you may have regarding the information given to you following your procedure. If we do not reach you, we will leave a message.  However, if you are feeling well and you are not experiencing any problems, there is no need to return our call.  We will assume that you have returned to your regular daily activities without incident.  If any biopsies were taken you will be contacted by phone or by letter within the next 1-3 weeks.  Please call us at [phone] if you have not heard about the biopsies in 3 weeks.    SIGNATURES/CONFIDENTIALITY: You and/or your care partner have signed paperwork which will be entered into your electronic medical record.  These signatures attest to the fact that that the information above on your After Visit Summary has been reviewed and is understood.  Full responsibility of the confidentiality of this discharge information lies with you and/or your care-partner.  Diverticulosis-handout given  Repeat colonoscopy in 10 years 2027.

## 2016-03-28 NOTE — Progress Notes (Signed)
A/ox3 pleased with MAC, report to April RN 

## 2016-03-28 NOTE — Op Note (Signed)
[facility] Patient Name: Kenneth Shaw Procedure Date: 03/28/2016 9:04 AM MRN: [id number] Endoscopist: Gatha Mayer , MD Age: 60 Referring MD:  Date of Birth: 1955/07/30 Gender: Male Account #: [id number] Procedure:                Colonoscopy Indications:              Screening for colorectal malignant neoplasm, Last                            colonoscopy: 2007 Medicines:                Propofol per Anesthesia, Monitored Anesthesia Care Procedure:                Pre-Anesthesia Assessment:                           - Prior to the procedure, a History and Physical                            was performed, and patient medications and                            allergies were reviewed. The patient's tolerance of                            previous anesthesia was also reviewed. The risks                            and benefits of the procedure and the sedation                            options and risks were discussed with the patient.                            All questions were answered, and informed consent                            was obtained. Prior Anticoagulants: The patient has                            taken no previous anticoagulant or antiplatelet                            agents. ASA Grade Assessment: III - A patient with                            severe systemic disease. After reviewing the risks                            and benefits, the patient was deemed in                            satisfactory condition to undergo the procedure.  After obtaining informed consent, the colonoscope                            was passed under direct vision. Throughout the                            procedure, the patient's blood pressure, pulse, and                            oxygen saturations were monitored continuously. The                            Model PCF-H190L [id number]) scope was introduced                            through the anus  and advanced to the the cecum,                            identified by appendiceal orifice and ileocecal                            valve. The colonoscopy was performed without                            difficulty. The patient tolerated the procedure                            well. The quality of the bowel preparation was                            adequate. The bowel preparation used was Miralax.                            The ileocecal valve, appendiceal orifice, and                            rectum were photographed. Scope In: 9:13:49 AM Scope Out: 9:30:34 AM Scope Withdrawal Time: 0 hours 12 minutes 3 seconds  Total Procedure Duration: 0 hours 16 minutes 45 seconds  Findings:                 The perianal and digital rectal examinations were                            normal. Pertinent negatives include normal prostate                            (size, shape, and consistency).                           Multiple diverticula were found in the sigmoid                            colon.  The exam was otherwise without abnormality on                            direct and retroflexion views. Complications:            No immediate complications. Estimated Blood Loss:     Estimated blood loss: none. Impression:               - Diverticulosis in the sigmoid colon.                           - The examination was otherwise normal on direct                            and retroflexion views.                           - No specimens collected. Recommendation:           - Patient has a contact number available for                            emergencies. The signs and symptoms of potential                            delayed complications were discussed with the                            patient. Return to normal activities tomorrow.                            Written discharge instructions were provided to the                            patient.                            - Continue present medications.                           - Repeat colonoscopy in 10 years for screening                            purposes.                           - Resume previous diet. Gatha Mayer, MD 03/28/2016 9:36:51 AM This report has been signed electronically.

## 2016-03-29 ENCOUNTER — Telehealth: Payer: Self-pay

## 2016-03-29 NOTE — Telephone Encounter (Signed)
Attempted to reach pt. Following procedure yesterday.   LM on pt.'s ans. Machine to call if he has any questions or concerns.   Will try later to reach pt. Again today.

## 2016-03-30 ENCOUNTER — Telehealth: Payer: Self-pay | Admitting: *Deleted

## 2016-03-30 NOTE — Telephone Encounter (Signed)
  Follow up Call-  Call back number 03/28/2016  Post procedure Call Back phone  # [phone]  Permission to leave phone message Yes  Some recent data might be hidden     Patient questions:  Do you have a fever, pain , or abdominal swelling? No. Pain Score  0 *  Have you tolerated food without any problems? Yes.    Have you been able to return to your normal activities? Yes.    Do you have any questions about your discharge instructions: Diet   No. Medications  No. Follow up visit  No.  Do you have questions or concerns about your Care? No.  Actions: * If pain score is 4 or above: No action needed, pain <4.   "Everybody was great from the time I checked in until I left."

## 2016-03-31 ENCOUNTER — Other Ambulatory Visit (INDEPENDENT_AMBULATORY_CARE_PROVIDER_SITE_OTHER): Payer: BLUE CROSS/BLUE SHIELD

## 2016-03-31 DIAGNOSIS — Z0000 Encounter for general adult medical examination without abnormal findings: Secondary | ICD-10-CM

## 2016-03-31 DIAGNOSIS — E119 Type 2 diabetes mellitus without complications: Secondary | ICD-10-CM | POA: Diagnosis not present

## 2016-03-31 LAB — LIPID PANEL
CHOL/HDL RATIO: 2
Cholesterol: 135 mg/dL (ref 0–200)
HDL: 59.2 mg/dL (ref >39.00)
LDL Cholesterol: 62 mg/dL (ref 0–99)
NONHDL: 75.35
Triglycerides: 69 mg/dL (ref 0.0–149.0)
VLDL: 13.8 mg/dL (ref 0.0–40.0)

## 2016-03-31 LAB — COMPREHENSIVE METABOLIC PANEL
ALT: 17 U/L (ref 0–53)
AST: 17 U/L (ref 0–37)
Albumin: 4.4 g/dL (ref 3.5–5.2)
Alkaline Phosphatase: 47 U/L (ref 39–117)
BUN: 23 mg/dL (ref 6–23)
CHLORIDE: 105 meq/L (ref 96–112)
CO2: 30 meq/L (ref 19–32)
Calcium: 9.5 mg/dL (ref 8.4–10.5)
Creatinine, Ser: 1.11 mg/dL (ref 0.40–1.50)
GFR: 71.54 mL/min (ref >60.00)
GLUCOSE: 111 mg/dL — AB (ref 70–99)
POTASSIUM: 4.2 meq/L (ref 3.5–5.1)
Sodium: 142 mEq/L (ref 135–145)
Total Bilirubin: 0.7 mg/dL (ref 0.2–1.2)
Total Protein: 6.3 g/dL (ref 6.0–8.3)

## 2016-03-31 LAB — CBC
HCT: 40.5 % (ref 39.0–52.0)
HEMATOCRIT: 39.7 % (ref 39.0–52.0)
HEMOGLOBIN: 13.5 g/dL (ref 13.0–17.0)
Hemoglobin: 13.4 g/dL (ref 13.0–17.0)
MCHC: 33.5 g/dL (ref 30.0–36.0)
MCHC: 33.8 g/dL (ref 30.0–36.0)
MCV: 92.5 fl (ref 78.0–100.0)
MCV: 91.6 fl (ref 78.0–100.0)
PLATELETS: 222 10*3/uL (ref 150.0–400.0)
Platelets: 217 10*3/uL (ref 150.0–400.0)
RBC: 4.34 Mil/uL (ref 4.22–5.81)
RBC: 4.37 Mil/uL (ref 4.22–5.81)
RDW: 13.8 % (ref 11.5–15.5)
RDW: 13.7 % (ref 11.5–15.5)
WBC: 6.8 10*3/uL (ref 4.0–10.5)
WBC: 6.5 10*3/uL (ref 4.0–10.5)

## 2016-03-31 LAB — HEMOGLOBIN A1C: HEMOGLOBIN A1C: 6 % (ref 4.6–6.5)

## 2016-03-31 LAB — MICROALBUMIN / CREATININE URINE RATIO
CREATININE, U: 218.2 mg/dL
MICROALB/CREAT RATIO: 0.4 mg/g (ref 0.0–30.0)
Microalb, Ur: 0.9 mg/dL (ref 0.0–1.9)

## 2016-03-31 LAB — PSA: PSA: 3.34 ng/mL (ref 0.10–4.00)

## 2016-03-31 LAB — TESTOSTERONE: Testosterone: 451.71 ng/dL (ref 300.00–890.00)

## 2016-04-03 ENCOUNTER — Encounter: Payer: Self-pay | Admitting: Family

## 2016-04-04 LAB — TSH: TSH: 2.95 u[IU]/mL (ref 0.35–4.50)

## 2016-04-06 ENCOUNTER — Ambulatory Visit (INDEPENDENT_AMBULATORY_CARE_PROVIDER_SITE_OTHER): Payer: BLUE CROSS/BLUE SHIELD | Admitting: Family

## 2016-04-06 ENCOUNTER — Encounter: Payer: Self-pay | Admitting: Family

## 2016-04-06 ENCOUNTER — Encounter: Payer: BLUE CROSS/BLUE SHIELD | Admitting: Family

## 2016-04-06 DIAGNOSIS — Z0000 Encounter for general adult medical examination without abnormal findings: Secondary | ICD-10-CM | POA: Diagnosis not present

## 2016-04-06 MED ORDER — ATORVASTATIN CALCIUM 20 MG PO TABS: 20.0000 mg | ORAL_TABLET | Freq: Every day | ORAL | 3 refills | Status: DC

## 2016-04-06 MED ORDER — METFORMIN HCL 500 MG PO TABS: 500.0000 mg | ORAL_TABLET | Freq: Two times a day (BID) | ORAL | 3 refills | Status: DC

## 2016-04-06 MED ORDER — SILDENAFIL CITRATE 20 MG PO TABS: ORAL_TABLET | ORAL | 3 refills | Status: DC

## 2016-04-06 NOTE — Progress Notes (Signed)
Subjective:    Patient ID: Kenneth Shaw, male    DOB: [birth year], 60 y.o.   MRN: [id number]  Chief Complaint  Patient presents with  . CPE    fasting, refills on metformin, sildenafil, and atorvastatin    HPI:  Kenneth Shaw is a 60 y.o. male who presents today for an annual wellness visit.   1) Health Maintenance -   Diet - Averages about 2-3 meals per day consisting of a regular diet; Caffeine intake of about 2-3 cups per day.  Exercise - At the gym 6 times per week with mixture of cardio and resistance training   2) Preventative Exams / Immunizations:  Dental -- Up to date  Vision -- Up to date   Health Maintenance  Topic Date Due  . PNEUMOCOCCAL POLYSACCHARIDE VACCINE (1) 01/24/1957  . HIV Screening  01/24/1970  . FOOT EXAM  09/17/2014  . ZOSTAVAX  01/25/2015  . OPHTHALMOLOGY EXAM  03/01/2016  . INFLUENZA VACCINE  09/03/2016 (Originally 01/05/2016)  . HEMOGLOBIN A1C  09/29/2016  . URINE MICROALBUMIN  03/31/2017  . TETANUS/TDAP  10/29/2018  . COLONOSCOPY  03/28/2026  . Hepatitis C Screening  Completed     Immunization History  Administered Date(s) Administered  . Td 10/28/2008     Allergies  Allergen Reactions  . Xarelto [Rivaroxaban] Rash     Outpatient Medications Prior to Visit  Medication Sig Dispense Refill  . aspirin 81 MG tablet Take 81 mg by mouth daily.     . Cholecalciferol (VITAMIN D-3) 1000 UNITS CAPS Take by mouth 2 (two) times daily.    . diphenhydrAMINE (BENADRYL) 25 MG tablet Take 25 mg by mouth at bedtime as needed.    . Multiple Vitamin (MULTIVITAMIN) tablet Take 1 tablet by mouth daily.     . NON FORMULARY BIPAP MACHINE    . atorvastatin (LIPITOR) 20 MG tablet Take 1 tablet (20 mg total) by mouth daily. 90 tablet 3  . metFORMIN (GLUCOPHAGE) 500 MG tablet Take 1 tablet (500 mg total) by mouth 2 (two) times daily with a meal. 180 tablet 3  . sildenafil (REVATIO) 20 MG tablet 2-5 qd prn 50 tablet 1   Facility-Administered  Medications Prior to Visit  Medication Dose Route Frequency Provider Last Rate Last Dose  . 0.9 %  sodium chloride infusion  500 mL Intravenous Continuous Gatha Mayer, MD         Past Medical History:  Diagnosis Date  . ADD (attention deficit disorder)    no official testing  . Anisocoria    chronic  . Arthritis   . Basal cell cancer    Dr Denna Haggard  . Diverticulosis   . DM (diabetes mellitus) ([place])    diet controlled  . fracture as child   left arm no surgery  . Goiter    SMALL  . History of sleep apnea    BiPAP  . Hyperlipidemia   . Nonspecific ST-T changes    early repolarization  . Sleep apnea    bipap 10.7 inhale 7 on exhale     Past Surgical History:  Procedure Laterality Date  . COLONOSCOPY  2006   Tics; [facility]  . HERNIA REPAIR  8- 10 yrs ago   x 2 , inguinal  . KNEE ARTHROSCOPY W/ ACL RECONSTRUCTION  1980   Left Knee   . KNEE SURGERY  2008   arthroscopy L knee, [facility]; Dr Tonita Cong  . THRYOID BIOPSY    . TONSILLECTOMY  as child  . TOTAL KNEE ARTHROPLASTY  04/19/2012   Procedure: TOTAL KNEE ARTHROPLASTY;  Surgeon: Gearlean Alf, MD;  Location: [facility];  Service: Orthopedics;  Laterality: Left;  Marland Kitchen VASECTOMY       Family History  Problem Relation Age of Onset  . Diabetes Maternal Grandmother   . Ovarian cancer Maternal Aunt   . Sleep apnea Father   . Bipolar disorder Sister   . Hyperlipidemia Mother   . Hypertension Mother   . Thyroid disease Mother     hypothyroidism  . Atrial fibrillation Mother   . Heart attack Maternal Uncle     3 M uncles mid 75s  . Heart attack Maternal Grandfather     in 47s  . Bone cancer Paternal Grandmother   . Bone cancer Paternal Uncle   . Stroke Paternal Grandfather     cns aneurysm  . Prostate cancer Neg Hx   . Colon cancer Neg Hx      Social History   Social History  . Marital status: Married    Spouse name: N/A  . Number of children: 2  . Years of education: 16   Occupational History  . Arts administrator    Social History Main Topics  . Smoking status: Former Smoker    Packs/day: 2.00    Years: 8.00    Types: Cigarettes    Quit date: 06/06/1982  . Smokeless tobacco: Never Used     Comment: 1976-1981, up to 2 ppd  . Alcohol use No  . Drug use: No  . Sexual activity: Not on file   Other Topics Concern  . Not on file   Social History Narrative   GETS REG EXERCISE--SWIMS          Review of Systems  Constitutional: Denies fever, chills, fatigue, or significant weight gain/loss. HENT: Head: Denies headache or neck pain Ears: Denies changes in hearing, ringing in ears, earache, drainage Nose: Denies discharge, stuffiness, itching, nosebleed, sinus pain Throat: Denies sore throat, hoarseness, dry mouth, sores, thrush Eyes: Denies loss/changes in vision, pain, redness, blurry/double vision, flashing lights Cardiovascular: Denies chest pain/discomfort, tightness, palpitations, shortness of breath with activity, difficulty lying down, swelling, sudden awakening with shortness of breath Respiratory: Denies shortness of breath, cough, sputum production, wheezing Gastrointestinal: Denies dysphasia, heartburn, change in appetite, nausea, change in bowel habits, rectal bleeding, constipation, diarrhea, yellow skin or eyes Genitourinary: Denies frequency, urgency, burning/pain, blood in urine, incontinence, change in urinary strength. Musculoskeletal: Denies muscle/joint pain, stiffness, back pain, redness or swelling of joints, trauma Skin: Denies rashes, lumps, itching, dryness, color changes, or hair/nail changes Neurological: Denies dizziness, fainting, seizures, weakness, numbness, tingling, tremor Psychiatric - Denies nervousness, stress, depression or memory loss Endocrine: Denies heat or cold intolerance, sweating, frequent urination, excessive thirst, changes in appetite Hematologic: Denies ease of bruising or bleeding     Objective:     BP 136/90 (BP Location:  Left Arm, Patient Position: Sitting, Cuff Size: Normal)   Pulse 63   Temp 98.1 F (36.7 C) (Oral)   Resp 16   Ht 6\' 3"  (1.905 m)   Wt 217 lb (98.4 kg)   SpO2 98%   BMI 27.12 kg/m  Nursing note and vital signs reviewed.  Physical Exam  Constitutional: He is oriented to person, place, and time. He appears well-developed and well-nourished.  HENT:  Head: Normocephalic.  Right Ear: Hearing, tympanic membrane, external ear and ear canal normal.  Left Ear: Hearing, tympanic membrane, external ear and  ear canal normal.  Nose: Nose normal.  Mouth/Throat: Uvula is midline, oropharynx is clear and moist and mucous membranes are normal.  Eyes: Conjunctivae and EOM are normal. Pupils are equal, round, and reactive to light.  Neck: Neck supple. No JVD present. No tracheal deviation present. No thyromegaly present.  Cardiovascular: Normal rate, regular rhythm, normal heart sounds and intact distal pulses.   Pulmonary/Chest: Effort normal and breath sounds normal.  Abdominal: Soft. Bowel sounds are normal. He exhibits no distension and no mass. There is no tenderness. There is no rebound and no guarding.  Musculoskeletal: Normal range of motion. He exhibits no edema or tenderness.  Lymphadenopathy:    He has no cervical adenopathy.  Neurological: He is alert and oriented to person, place, and time. He has normal reflexes. No cranial nerve deficit. He exhibits normal muscle tone. Coordination normal.  Skin: Skin is warm and dry.  Psychiatric: He has a normal mood and affect. His behavior is normal. Judgment and thought content normal.       Assessment & Plan:   Problem List Items Addressed This Visit      Other   Routine general medical examination at a health care facility    1) Anticipatory Guidance: Discussed importance of wearing a seatbelt while driving and not texting while driving; changing batteries in smoke detector at least once annually; wearing suntan lotion when outside; eating  a balanced and moderate diet; getting physical activity at least 30 minutes per day.  2) Immunizations / Screenings / Labs:  Declines influenza, Pneumovax, and Zostavax. All other immunizations are up-to-date per recommendations. All screenings are up-to-date per recommendations. Blood work previously completed and reviewed with patient with no significant irregularities.  Overall well exam with risk factors for cardiovascular disease including hyperlipidemia and type 2 diabetes. He has been noted in the past to have hyperplasia of the prostate. Chronic conditions appear well controlled with current medication regimens and no adverse side effects. He has lost 60 pounds since his previous physical. Recommend continuing physical activity and nutritional intake that is moderate, varied, and balanced. Continue other healthy lifestyle behaviors and choices. Follow-up prevention exam in 1 year. Follow-up office visit for chronic conditions as needed.       Other Visit Diagnoses   None.      I am having Mr. Uno maintain his aspirin, multivitamin, Vitamin D-3, NON FORMULARY, diphenhydrAMINE, atorvastatin, metFORMIN, and sildenafil. We will continue to administer sodium chloride.   Meds ordered this encounter  Medications  . atorvastatin (LIPITOR) 20 MG tablet    Sig: Take 1 tablet (20 mg total) by mouth daily.    Dispense:  90 tablet    Refill:  3    Order Specific Question:   Supervising Provider    Answer:   Pricilla Holm A [id number]  . metFORMIN (GLUCOPHAGE) 500 MG tablet    Sig: Take 1 tablet (500 mg total) by mouth 2 (two) times daily with a meal.    Dispense:  180 tablet    Refill:  3    Order Specific Question:   Supervising Provider    Answer:   Pricilla Holm A [id number]  . sildenafil (REVATIO) 20 MG tablet    Sig: 2-5 qd prn    Dispense:  50 tablet    Refill:  3    Order Specific Question:   Supervising Provider    Answer:   Pricilla Holm A [id number]      Follow-up: Return if symptoms worsen or fail  to improve.   Mauricio Po, FNP

## 2016-04-06 NOTE — Patient Instructions (Signed)
Thank you for choosing [facility].  SUMMARY AND INSTRUCTIONS:  Medication:  Please continue to take your medication as prescribed.   Follow up:  If your symptoms worsen or fail to improve, please contact our office for further instruction, or in case of emergency go directly to the emergency room at the closest medical facility.    Health Maintenance, Male A healthy lifestyle and preventative care can promote health and wellness.  Maintain regular health, dental, and eye exams.  Eat a healthy diet. Foods like vegetables, fruits, whole grains, low-fat dairy products, and lean protein foods contain the nutrients you need and are low in calories. Decrease your intake of foods high in solid fats, added sugars, and salt. Get information about a proper diet from your health care provider, if necessary.  Regular physical exercise is one of the most important things you can do for your health. Most adults should get at least 150 minutes of moderate-intensity exercise (any activity that increases your heart rate and causes you to sweat) each week. In addition, most adults need muscle-strengthening exercises on 2 or more days a week.   Maintain a healthy weight. The body mass index (BMI) is a screening tool to identify possible weight problems. It provides an estimate of body fat based on height and weight. Your health care provider can find your BMI and can help you achieve or maintain a healthy weight. For males 20 years and older:  A BMI below 18.5 is considered underweight.  A BMI of 18.5 to 24.9 is normal.  A BMI of 25 to 29.9 is considered overweight.  A BMI of 30 and above is considered obese.  Maintain normal blood lipids and cholesterol by exercising and minimizing your intake of saturated fat. Eat a balanced diet with plenty of fruits and vegetables. Blood tests for lipids and cholesterol should begin at age 38 and be repeated every 5 years. If your lipid or cholesterol  levels are high, you are over age 54, or you are at high risk for heart disease, you may need your cholesterol levels checked more frequently.Ongoing high lipid and cholesterol levels should be treated with medicines if diet and exercise are not working.  If you smoke, find out from your health care provider how to quit. If you do not use tobacco, do not start.  Lung cancer screening is recommended for adults aged 34-80 years who are at high risk for developing lung cancer because of a history of smoking. A yearly low-dose CT scan of the lungs is recommended for people who have at least a 30-pack-year history of smoking and are current smokers or have quit within the past 15 years. A pack year of smoking is smoking an average of 1 pack of cigarettes a day for 1 year (for example, a 30-pack-year history of smoking could mean smoking 1 pack a day for 30 years or 2 packs a day for 15 years). Yearly screening should continue until the smoker has stopped smoking for at least 15 years. Yearly screening should be stopped for people who develop a health problem that would prevent them from having lung cancer treatment.  If you choose to drink alcohol, do not have more than 2 drinks per day. One drink is considered to be 12 oz (360 mL) of beer, 5 oz (150 mL) of wine, or 1.5 oz (45 mL) of liquor.  Avoid the use of street drugs. Do not share needles with anyone. Ask for help if you need support  or instructions about stopping the use of drugs.  High blood pressure causes heart disease and increases the risk of stroke. High blood pressure is more likely to develop in:  People who have blood pressure in the end of the normal range (100-139/85-89 mm Hg).  People who are overweight or obese.  People who are African American.  If you are 70-41 years of age, have your blood pressure checked every 3-5 years. If you are 56 years of age or older, have your blood pressure checked every year. You should have your blood  pressure measured twice--once when you are at a hospital or clinic, and once when you are not at a hospital or clinic. Record the average of the two measurements. To check your blood pressure when you are not at a hospital or clinic, you can use:  An automated blood pressure machine at a pharmacy.  A home blood pressure monitor.  If you are 45-63 years old, ask your health care provider if you should take aspirin to prevent heart disease.  Diabetes screening involves taking a blood sample to check your fasting blood sugar level. This should be done once every 3 years after age 49 if you are at a normal weight and without risk factors for diabetes. Testing should be considered at a younger age or be carried out more frequently if you are overweight and have at least 1 risk factor for diabetes.  Colorectal cancer can be detected and often prevented. Most routine colorectal cancer screening begins at the age of 34 and continues through age 45. However, your health care provider may recommend screening at an earlier age if you have risk factors for colon cancer. On a yearly basis, your health care provider may provide home test kits to check for hidden blood in the stool. A small camera at the end of a tube may be used to directly examine the colon (sigmoidoscopy or colonoscopy) to detect the earliest forms of colorectal cancer. Talk to your health care provider about this at age 99 when routine screening begins. A direct exam of the colon should be repeated every 5-10 years through age 59, unless early forms of precancerous polyps or small growths are found.  People who are at an increased risk for hepatitis B should be screened for this virus. You are considered at high risk for hepatitis B if:  You were born in a country where hepatitis B occurs often. Talk with your health care provider about which countries are considered high risk.  Your parents were born in a high-risk country and you have not  received a shot to protect against hepatitis B (hepatitis B vaccine).  You have HIV or AIDS.  You use needles to inject street drugs.  You live with, or have sex with, someone who has hepatitis B.  You are a man who has sex with other men (MSM).  You get hemodialysis treatment.  You take certain medicines for conditions like cancer, organ transplantation, and autoimmune conditions.  Hepatitis C blood testing is recommended for all people born from [birth year] through 1966 and any individual with known risk factors for hepatitis C.  Healthy men should no longer receive prostate-specific antigen (PSA) blood tests as part of routine cancer screening. Talk to your health care provider about prostate cancer screening.  Testicular cancer screening is not recommended for adolescents or adult males who have no symptoms. Screening includes self-exam, a health care provider exam, and other screening tests. Consult with your health  care provider about any symptoms you have or any concerns you have about testicular cancer.  Practice safe sex. Use condoms and avoid high-risk sexual practices to reduce the spread of sexually transmitted infections (STIs).  You should be screened for STIs, including gonorrhea and chlamydia if:  You are sexually active and are younger than 24 years.  You are older than 24 years, and your health care provider tells you that you are at risk for this type of infection.  Your sexual activity has changed since you were last screened, and you are at an increased risk for chlamydia or gonorrhea. Ask your health care provider if you are at risk.  If you are at risk of being infected with HIV, it is recommended that you take a prescription medicine daily to prevent HIV infection. This is called pre-exposure prophylaxis (PrEP). You are considered at risk if:  You are a man who has sex with other men (MSM).  You are a heterosexual man who is sexually active with multiple  partners.  You take drugs by injection.  You are sexually active with a partner who has HIV.  Talk with your health care provider about whether you are at high risk of being infected with HIV. If you choose to begin PrEP, you should first be tested for HIV. You should then be tested every 3 months for as long as you are taking PrEP.  Use sunscreen. Apply sunscreen liberally and repeatedly throughout the day. You should seek shade when your shadow is shorter than you. Protect yourself by wearing long sleeves, pants, a wide-brimmed hat, and sunglasses year round whenever you are outdoors.  Tell your health care provider of new moles or changes in moles, especially if there is a change in shape or color. Also, tell your health care provider if a mole is larger than the size of a pencil eraser.  A one-time screening for abdominal aortic aneurysm (AAA) and surgical repair of large AAAs by ultrasound is recommended for men aged 44-75 years who are current or former smokers.  Stay current with your vaccines (immunizations).   This information is not intended to replace advice given to you by your health care provider. Make sure you discuss any questions you have with your health care provider.   Document Released: 11/19/2007 Document Revised: 06/13/2014 Document Reviewed: 10/18/2010 Elsevier Interactive Patient Education [employer].

## 2016-04-06 NOTE — Assessment & Plan Note (Signed)
1) Anticipatory Guidance: Discussed importance of wearing a seatbelt while driving and not texting while driving; changing batteries in smoke detector at least once annually; wearing suntan lotion when outside; eating a balanced and moderate diet; getting physical activity at least 30 minutes per day.  2) Immunizations / Screenings / Labs:  Declines influenza, Pneumovax, and Zostavax. All other immunizations are up-to-date per recommendations. All screenings are up-to-date per recommendations. Blood work previously completed and reviewed with patient with no significant irregularities.  Overall well exam with risk factors for cardiovascular disease including hyperlipidemia and type 2 diabetes. He has been noted in the past to have hyperplasia of the prostate. Chronic conditions appear well controlled with current medication regimens and no adverse side effects. He has lost 60 pounds since his previous physical. Recommend continuing physical activity and nutritional intake that is moderate, varied, and balanced. Continue other healthy lifestyle behaviors and choices. Follow-up prevention exam in 1 year. Follow-up office visit for chronic conditions as needed.

## 2016-06-21 ENCOUNTER — Encounter: Payer: Self-pay | Admitting: Family

## 2016-06-21 ENCOUNTER — Ambulatory Visit (INDEPENDENT_AMBULATORY_CARE_PROVIDER_SITE_OTHER): Payer: BLUE CROSS/BLUE SHIELD | Admitting: Family

## 2016-06-21 VITALS — BP 130/80 | HR 58 | Temp 97.7°F | Resp 16 | Ht 75.0 in | Wt 221.0 lb

## 2016-06-21 DIAGNOSIS — M62838 Other muscle spasm: Secondary | ICD-10-CM | POA: Diagnosis not present

## 2016-06-21 DIAGNOSIS — E782 Mixed hyperlipidemia: Secondary | ICD-10-CM

## 2016-06-21 MED ORDER — IBUPROFEN-FAMOTIDINE 800-26.6 MG PO TABS: 1.0000 | ORAL_TABLET | Freq: Three times a day (TID) | ORAL | 1 refills | Status: DC | PRN

## 2016-06-21 MED ORDER — DICLOFENAC SODIUM 2 % TD SOLN: 1.0000 "application " | Freq: Two times a day (BID) | TRANSDERMAL | 1 refills | Status: DC | PRN

## 2016-06-21 MED ORDER — TIZANIDINE HCL 4 MG PO TABS: 4.0000 mg | ORAL_TABLET | Freq: Four times a day (QID) | ORAL | 0 refills | Status: DC | PRN

## 2016-06-21 NOTE — Patient Instructions (Signed)
Thank you for choosing [facility]!  Ice / moist heat x 20 minutes every 2 hours and as needed or following activity  Pennsaid - Approximately 1/2 packet to the affected site twice daily.  Duexis - 1 tablet 3 times per day for the next 5-7 days and then as needed.  Exercises 1-2 times per day as instructed.   You will receive a call from [facility] regarding your Pennsaid/Duexis/Vimovo. The medication will be mailed to you and should cost you no more than $10 per item or possibly free depending upon your insurance.   You will receive a call to schedule your imaging, appointment or physical therapy.  Your prescription(s) have been submitted to your pharmacy or been printed and provided for you. Please take as directed and contact our office if you believe you are having problem(s) with the medication(s) or have any questions.  If your symptoms worsen or fail to improve, please contact our office for further instruction, or in case of emergency go directly to the emergency room at the closest medical facility.    Cervical Strain and Sprain Rehab Ask your health care provider which exercises are safe for you. Do exercises exactly as told by your health care provider and adjust them as directed. It is normal to feel mild stretching, pulling, tightness, or discomfort as you do these exercises, but you should stop right away if you feel sudden pain or your pain gets worse.Do not begin these exercises until told by your health care provider. Stretching and range of motion exercises These exercises warm up your muscles and joints and improve the movement and flexibility of your neck. These exercises also help to relieve pain, numbness, and tingling. Exercise A: Cervical side bend 1. Using good posture, sit on a stable chair or stand up. 2. Without moving your shoulders, slowly tilt your left / right ear to your shoulder until you feel a stretch in your neck muscles. You should be  looking straight ahead. 3. Hold for __________ seconds. 4. Repeat with the other side of your neck. Repeat __________ times. Complete this exercise __________ times a day. Exercise B: Cervical rotation 1. Using good posture, sit on a stable chair or stand up. 2. Slowly turn your head to the side as if you are looking over your left / right shoulder.  Keep your eyes level with the ground.  Stop when you feel a stretch along the side and the back of your neck. 3. Hold for __________ seconds. 4. Repeat this by turning to your other side. Repeat __________ times. Complete this exercise __________ times a day. Exercise C: Thoracic extension and pectoral stretch 1. Roll a towel or a small blanket so it is about 4 inches (10 cm) in diameter. 2. Lie down on your back on a firm surface. 3. Put the towel lengthwise, under your spine in the middle of your back. It should not be not under your shoulder blades. The towel should line up with your spine from your middle back to your lower back. 4. Put your hands behind your head and let your elbows fall out to your sides. 5. Hold for __________ seconds. Repeat __________ times. Complete this exercise __________ times a day. Strengthening exercises These exercises build strength and endurance in your neck. Endurance is the ability to use your muscles for a long time, even after your muscles get tired. Exercise D: Upper cervical flexion, isometric 1. Lie on your back with a thin pillow behind your head  and a small rolled-up towel under your neck. 2. Gently tuck your chin toward your chest and nod your head down to look toward your feet. Do not lift your head off the pillow. 3. Hold for __________ seconds. 4. Release the tension slowly. Relax your neck muscles completely before you repeat this exercise. Repeat __________ times. Complete this exercise __________ times a day. Exercise E: Cervical extension, isometric 1. Stand about 6 inches (15 cm) away  from a wall, with your back facing the wall. 2. Place a soft object, about 6-8 inches (15-20 cm) in diameter, between the back of your head and the wall. A soft object could be a small pillow, a ball, or a folded towel. 3. Gently tilt your head back and press into the soft object. Keep your jaw and forehead relaxed. 4. Hold for __________ seconds. 5. Release the tension slowly. Relax your neck muscles completely before you repeat this exercise. Repeat __________ times. Complete this exercise __________ times a day. Posture and body mechanics   Body mechanics refers to the movements and positions of your body while you do your daily activities. Posture is part of body mechanics. Good posture and healthy body mechanics can help to relieve stress in your body's tissues and joints. Good posture means that your spine is in its natural S-curve position (your spine is neutral), your shoulders are pulled back slightly, and your head is not tipped forward. The following are general guidelines for applying improved posture and body mechanics to your everyday activities. Standing  When standing, keep your spine neutral and keep your feet about hip-width apart. Keep a slight bend in your knees. Your ears, shoulders, and hips should line up.  When you do a task in which you stand in one place for a long time, place one foot up on a stable object that is 2-4 inches (5-10 cm) high, such as a footstool. This helps keep your spine neutral. Sitting  When sitting, keep your spine neutral and your keep feet flat on the floor. Use a footrest, if necessary, and keep your thighs parallel to the floor. Avoid rounding your shoulders, and avoid tilting your head forward.  When working at a desk or a computer, keep your desk at a height where your hands are slightly lower than your elbows. Slide your chair under your desk so you are close enough to maintain good posture.  When working at a computer, place your monitor at a  height where you are looking straight ahead and you do not have to tilt your head forward or downward to look at the screen. Resting When lying down and resting, avoid positions that are most painful for you. Try to support your neck in a neutral position. You can use a contour pillow or a small rolled-up towel. Your pillow should support your neck but not push on it. This information is not intended to replace advice given to you by your health care provider. Make sure you discuss any questions you have with your health care provider. Document Released: 05/23/2005 Document Revised: 01/28/2016 Document Reviewed: 04/29/2015 Elsevier Interactive Patient Education  2017 [employer].

## 2016-06-21 NOTE — Progress Notes (Signed)
Subjective:    Patient ID: Kenneth Shaw, male    DOB: May 04, 1956, 61 y.o.   MRN: [id number]  Chief Complaint  Patient presents with  . Shoulder Pain    left shoulder and arm pain, has happened another time 8 months ago and lasted a few days but this time has lasted a week, hurts more sitting and laying down     HPI:  Kenneth Shaw is a 61 y.o. male who  has a past medical history of ADD (attention deficit disorder); Anisocoria; Arthritis; Basal cell cancer; Diverticulosis; DM (diabetes mellitus) ([place]); fracture (as child); Goiter; History of sleep apnea; Hyperlipidemia; Nonspecific ST-T changes; and Sleep apnea. and presents today for an acute office visit.   1.) Cervical muscle spasm - This is a new problem. Associated symptoms of pain located in left shoulder and arm having been going on for about 1 week. Denies any trauma or injury. Notes this has happened in the past which had improved without treatment. Pain is described as achy and dull. Severity is enough to effect sleep. He has been resistance training recently. Modifying factors include heat and a lidocaine which seem to help. No numbness or tingling.   2.) Hyperlipidemia - Currently Maintained on atorvastatin. Reports taking the medication as prescribed and denies adverse side effects or myalgias. Cholesterol has been generally well controlled with current medication regimen. He has lost 60 pounds and continues to work on nutrition and physical activity.  Lab Results  Component Value Date   CHOL 135 03/31/2016   HDL 59.20 03/31/2016   LDLCALC 62 03/31/2016   LDLDIRECT 137.2 04/16/2013   TRIG 69.0 03/31/2016   CHOLHDL 2 03/31/2016     Allergies  Allergen Reactions  . Xarelto [Rivaroxaban] Rash      Outpatient Medications Prior to Visit  Medication Sig Dispense Refill  . aspirin 81 MG tablet Take 81 mg by mouth daily.     Marland Kitchen atorvastatin (LIPITOR) 20 MG tablet Take 1 tablet (20 mg total) by mouth daily. 90 tablet 3    . Cholecalciferol (VITAMIN D-3) 1000 UNITS CAPS Take by mouth 2 (two) times daily.    . diphenhydrAMINE (BENADRYL) 25 MG tablet Take 25 mg by mouth at bedtime as needed.    . metFORMIN (GLUCOPHAGE) 500 MG tablet Take 1 tablet (500 mg total) by mouth 2 (two) times daily with a meal. 180 tablet 3  . Multiple Vitamin (MULTIVITAMIN) tablet Take 1 tablet by mouth daily.     . NON FORMULARY BIPAP MACHINE    . sildenafil (REVATIO) 20 MG tablet 2-5 qd prn 50 tablet 3  . 0.9 %  sodium chloride infusion      No facility-administered medications prior to visit.       Past Medical History:  Diagnosis Date  . ADD (attention deficit disorder)    no official testing  . Anisocoria    chronic  . Arthritis   . Basal cell cancer    Dr Denna Haggard  . Diverticulosis   . DM (diabetes mellitus) ([place])    diet controlled  . fracture as child   left arm no surgery  . Goiter    SMALL  . History of sleep apnea    BiPAP  . Hyperlipidemia   . Nonspecific ST-T changes    early repolarization  . Sleep apnea    bipap 10.7 inhale 7 on exhale      Review of Systems  Constitutional: Negative for chills and fever.  Eyes:  Negative for changes in vision  Respiratory: Negative for cough, chest tightness and wheezing.   Cardiovascular: Negative for chest pain, palpitations and leg swelling.  Neurological: Negative for dizziness, weakness and light-headedness.      Objective:    BP 130/80 (BP Location: Left Arm, Patient Position: Sitting, Cuff Size: Large)   Pulse (!) 58   Temp 97.7 F (36.5 C) (Oral)   Resp 16   Ht 6\' 3"  (1.905 m)   Wt 221 lb (100.2 kg)   SpO2 98%   BMI 27.62 kg/m  Nursing note and vital signs reviewed.  Physical Exam  Constitutional: He is oriented to person, place, and time. He appears well-developed and well-nourished. No distress.  Neck:  No obvious deformity, discoloration, or edema. Mild tenderness and spasm located of the upper trapezius. No crepitus or deformity.  Distal pulses and sensation are intact and appropriate. Range of motion slightly decreased in lateral bending and left rotation. Negative cervical compression.  Cardiovascular: Normal rate, regular rhythm, normal heart sounds and intact distal pulses.   Pulmonary/Chest: Effort normal and breath sounds normal.  Neurological: He is alert and oriented to person, place, and time.  Skin: Skin is warm and dry.  Psychiatric: He has a normal mood and affect. His behavior is normal. Judgment and thought content normal.       Assessment & Plan:   Problem List Items Addressed This Visit      Other   HYPERLIPIDEMIA - Primary    Hold atorvastatin at this time. Long discussion regarding risk factors associated with diabetes and increased risk for heart disease. Patient wishes to continue withholding medication at this time as he has lost 60 pounds in his cholesterol levels have been adequately controlled. Follow-up in 3-6 months for lipid profile. Continue to monitor.      Cervical paraspinal muscle spasm    Symptoms and exam consistent with cervical paraspinal muscle spasms of the left upper trapezius. Treat conservatively with ice/moist heat, home exercise therapy, and start Pennsaid and Duexis. Follow-up if symptoms worsen or do not improve for potential imaging and/or formal physical therapy.      Relevant Medications   tiZANidine (ZANAFLEX) 4 MG tablet   Diclofenac Sodium (PENNSAID) 2 % SOLN   Ibuprofen-Famotidine 800-26.6 MG TABS       I am having Mr. Michalec start on tiZANidine, Diclofenac Sodium, and Ibuprofen-Famotidine. I am also having him maintain his aspirin, multivitamin, Vitamin D-3, NON FORMULARY, diphenhydrAMINE, atorvastatin, metFORMIN, and sildenafil. We will stop administering sodium chloride.   Meds ordered this encounter  Medications  . tiZANidine (ZANAFLEX) 4 MG tablet    Sig: Take 1 tablet (4 mg total) by mouth every 6 (six) hours as needed for muscle spasms.    Dispense:   30 tablet    Refill:  0    Order Specific Question:   Supervising Provider    Answer:   Pricilla Holm A [id number]  . Diclofenac Sodium (PENNSAID) 2 % SOLN    Sig: Place 1 application onto the skin 2 (two) times daily as needed.    Dispense:  112 g    Refill:  1    Order Specific Question:   Supervising Provider    Answer:   Pricilla Holm A [id number]  . Ibuprofen-Famotidine 800-26.6 MG TABS    Sig: Take 1 tablet by mouth 3 (three) times daily as needed.    Dispense:  90 tablet    Refill:  1    Order Specific Question:  Supervising Provider    Answer:   Pricilla Holm A [id number]     Follow-up: Return in about 1 month (around 07/22/2016), or if symptoms worsen or fail to improve.  Mauricio Po, FNP

## 2016-06-21 NOTE — Assessment & Plan Note (Signed)
Symptoms and exam consistent with cervical paraspinal muscle spasms of the left upper trapezius. Treat conservatively with ice/moist heat, home exercise therapy, and start Pennsaid and Duexis. Follow-up if symptoms worsen or do not improve for potential imaging and/or formal physical therapy.

## 2016-06-21 NOTE — Assessment & Plan Note (Signed)
Hold atorvastatin at this time. Long discussion regarding risk factors associated with diabetes and increased risk for heart disease. Patient wishes to continue withholding medication at this time as he has lost 60 pounds in his cholesterol levels have been adequately controlled. Follow-up in 3-6 months for lipid profile. Continue to monitor.

## 2016-09-27 ENCOUNTER — Encounter: Payer: Self-pay | Admitting: Family

## 2016-09-27 DIAGNOSIS — R7989 Other specified abnormal findings of blood chemistry: Secondary | ICD-10-CM

## 2016-09-27 DIAGNOSIS — E782 Mixed hyperlipidemia: Secondary | ICD-10-CM

## 2016-09-27 DIAGNOSIS — E119 Type 2 diabetes mellitus without complications: Secondary | ICD-10-CM

## 2016-10-19 ENCOUNTER — Other Ambulatory Visit (INDEPENDENT_AMBULATORY_CARE_PROVIDER_SITE_OTHER): Payer: BLUE CROSS/BLUE SHIELD

## 2016-10-19 ENCOUNTER — Encounter: Payer: Self-pay | Admitting: Family

## 2016-10-19 DIAGNOSIS — E119 Type 2 diabetes mellitus without complications: Secondary | ICD-10-CM

## 2016-10-19 DIAGNOSIS — E782 Mixed hyperlipidemia: Secondary | ICD-10-CM

## 2016-10-19 DIAGNOSIS — R7989 Other specified abnormal findings of blood chemistry: Secondary | ICD-10-CM | POA: Diagnosis not present

## 2016-10-19 LAB — HEMOGLOBIN A1C: Hgb A1c MFr Bld: 5.8 % (ref 4.6–6.5)

## 2016-10-19 LAB — LIPID PANEL
CHOLESTEROL: 188 mg/dL (ref 0–200)
HDL: 62.1 mg/dL (ref >39.00)
LDL Cholesterol: 112 mg/dL — ABNORMAL HIGH (ref 0–99)
NONHDL: 125.48
TRIGLYCERIDES: 67 mg/dL (ref 0.0–149.0)
Total CHOL/HDL Ratio: 3
VLDL: 13.4 mg/dL (ref 0.0–40.0)

## 2016-10-19 LAB — TESTOSTERONE: Testosterone: 480.56 ng/dL (ref 300.00–890.00)

## 2016-10-19 NOTE — Telephone Encounter (Signed)
Patient would like to know if he needs any other vacs based on his current record here.  Please follow up with patient at [phone].

## 2016-10-21 NOTE — Telephone Encounter (Signed)
Pt called in and would like someone to call him today.  He is kinda upset.  Marland Kitchen  He is leaving for [place] soon and would like to talk with someone today.    Best number [phone]

## 2016-10-24 ENCOUNTER — Ambulatory Visit: Payer: BLUE CROSS/BLUE SHIELD | Admitting: Family

## 2016-10-24 ENCOUNTER — Ambulatory Visit (INDEPENDENT_AMBULATORY_CARE_PROVIDER_SITE_OTHER): Payer: BLUE CROSS/BLUE SHIELD

## 2016-10-24 DIAGNOSIS — Z23 Encounter for immunization: Secondary | ICD-10-CM

## 2016-11-01 ENCOUNTER — Encounter: Payer: Self-pay | Admitting: Family

## 2017-02-15 ENCOUNTER — Telehealth: Payer: Self-pay | Admitting: Internal Medicine

## 2017-02-15 NOTE — Telephone Encounter (Signed)
Patient is requesting to establish with Dr. Ronnald Ramp.  He would like to have a male MD.

## 2017-02-15 NOTE — Telephone Encounter (Signed)
yes

## 2017-02-16 NOTE — Telephone Encounter (Signed)
Scheduled

## 2017-02-18 ENCOUNTER — Encounter: Payer: Self-pay | Admitting: Internal Medicine

## 2017-02-20 ENCOUNTER — Other Ambulatory Visit: Payer: Self-pay | Admitting: Internal Medicine

## 2017-02-20 DIAGNOSIS — N400 Enlarged prostate without lower urinary tract symptoms: Secondary | ICD-10-CM

## 2017-02-20 DIAGNOSIS — E042 Nontoxic multinodular goiter: Secondary | ICD-10-CM

## 2017-02-20 DIAGNOSIS — E118 Type 2 diabetes mellitus with unspecified complications: Secondary | ICD-10-CM

## 2017-02-20 DIAGNOSIS — E785 Hyperlipidemia, unspecified: Secondary | ICD-10-CM

## 2017-02-20 DIAGNOSIS — Z0000 Encounter for general adult medical examination without abnormal findings: Secondary | ICD-10-CM

## 2017-02-21 ENCOUNTER — Encounter: Payer: Self-pay | Admitting: Family

## 2017-02-21 LAB — HM DIABETES EYE EXAM

## 2017-04-05 ENCOUNTER — Other Ambulatory Visit (INDEPENDENT_AMBULATORY_CARE_PROVIDER_SITE_OTHER): Payer: BLUE CROSS/BLUE SHIELD

## 2017-04-05 DIAGNOSIS — Z0000 Encounter for general adult medical examination without abnormal findings: Secondary | ICD-10-CM

## 2017-04-05 DIAGNOSIS — E118 Type 2 diabetes mellitus with unspecified complications: Secondary | ICD-10-CM | POA: Diagnosis not present

## 2017-04-05 DIAGNOSIS — E042 Nontoxic multinodular goiter: Secondary | ICD-10-CM

## 2017-04-05 LAB — CBC WITH DIFFERENTIAL/PLATELET
BASOS ABS: 0 10*3/uL (ref 0.0–0.1)
Basophils Relative: 0.7 % (ref 0.0–3.0)
EOS ABS: 0.1 10*3/uL (ref 0.0–0.7)
EOS PCT: 2.5 % (ref 0.0–5.0)
HCT: 38.5 % — ABNORMAL LOW (ref 39.0–52.0)
HEMOGLOBIN: 13.1 g/dL (ref 13.0–17.0)
Lymphocytes Relative: 31.7 % (ref 12.0–46.0)
Lymphs Abs: 1.8 10*3/uL (ref 0.7–4.0)
MCHC: 34.2 g/dL (ref 30.0–36.0)
MCV: 92.6 fl (ref 78.0–100.0)
MONO ABS: 0.4 10*3/uL (ref 0.1–1.0)
Monocytes Relative: 7.1 % (ref 3.0–12.0)
NEUTROS PCT: 58 % (ref 43.0–77.0)
Neutro Abs: 3.3 10*3/uL (ref 1.4–7.7)
Platelets: 210 10*3/uL (ref 150.0–400.0)
RBC: 4.15 Mil/uL — AB (ref 4.22–5.81)
RDW: 14.3 % (ref 11.5–15.5)
WBC: 5.8 10*3/uL (ref 4.0–10.5)

## 2017-04-05 LAB — COMPREHENSIVE METABOLIC PANEL
ALBUMIN: 4.3 g/dL (ref 3.5–5.2)
ALK PHOS: 50 U/L (ref 39–117)
ALT: 15 U/L (ref 0–53)
AST: 18 U/L (ref 0–37)
BILIRUBIN TOTAL: 0.6 mg/dL (ref 0.2–1.2)
BUN: 22 mg/dL (ref 6–23)
CO2: 28 mEq/L (ref 19–32)
Calcium: 9.1 mg/dL (ref 8.4–10.5)
Chloride: 107 mEq/L (ref 96–112)
Creatinine, Ser: 0.94 mg/dL (ref 0.40–1.50)
GFR: 86.38 mL/min (ref >60.00)
GLUCOSE: 118 mg/dL — AB (ref 70–99)
Potassium: 4.5 mEq/L (ref 3.5–5.1)
SODIUM: 143 meq/L (ref 135–145)
TOTAL PROTEIN: 6.3 g/dL (ref 6.0–8.3)

## 2017-04-05 LAB — URINALYSIS, ROUTINE W REFLEX MICROSCOPIC
Bilirubin Urine: NEGATIVE
HGB URINE DIPSTICK: NEGATIVE
Ketones, ur: NEGATIVE
LEUKOCYTES UA: NEGATIVE
NITRITE: NEGATIVE
RBC / HPF: NONE SEEN (ref 0–?)
Total Protein, Urine: NEGATIVE
Urine Glucose: NEGATIVE
Urobilinogen, UA: 0.2 (ref 0.0–1.0)
pH: 5.5 (ref 5.0–8.0)

## 2017-04-05 LAB — LIPID PANEL
CHOLESTEROL: 135 mg/dL (ref 0–200)
HDL: 49 mg/dL (ref >39.00)
LDL Cholesterol: 76 mg/dL (ref 0–99)
NonHDL: 86.35
TRIGLYCERIDES: 52 mg/dL (ref 0.0–149.0)
Total CHOL/HDL Ratio: 3
VLDL: 10.4 mg/dL (ref 0.0–40.0)

## 2017-04-05 LAB — HEMOGLOBIN A1C: HEMOGLOBIN A1C: 6.1 % (ref 4.6–6.5)

## 2017-04-05 LAB — MICROALBUMIN / CREATININE URINE RATIO
CREATININE, U: 171.1 mg/dL
MICROALB/CREAT RATIO: 0.6 mg/g (ref 0.0–30.0)
Microalb, Ur: 1.1 mg/dL (ref 0.0–1.9)

## 2017-04-05 LAB — PSA: PSA: 4.17 ng/mL — AB (ref 0.10–4.00)

## 2017-04-05 LAB — TSH: TSH: 5.88 u[IU]/mL — ABNORMAL HIGH (ref 0.35–4.50)

## 2017-04-07 ENCOUNTER — Encounter: Payer: Self-pay | Admitting: Internal Medicine

## 2017-04-10 ENCOUNTER — Encounter: Payer: Self-pay | Admitting: Internal Medicine

## 2017-04-18 ENCOUNTER — Other Ambulatory Visit (INDEPENDENT_AMBULATORY_CARE_PROVIDER_SITE_OTHER): Payer: BLUE CROSS/BLUE SHIELD

## 2017-04-18 ENCOUNTER — Ambulatory Visit (INDEPENDENT_AMBULATORY_CARE_PROVIDER_SITE_OTHER): Payer: BLUE CROSS/BLUE SHIELD | Admitting: Internal Medicine

## 2017-04-18 ENCOUNTER — Encounter: Payer: Self-pay | Admitting: Internal Medicine

## 2017-04-18 ENCOUNTER — Encounter: Payer: Self-pay | Admitting: Family

## 2017-04-18 ENCOUNTER — Telehealth: Payer: Self-pay

## 2017-04-18 VITALS — BP 112/72 | HR 72 | Temp 98.3°F | Resp 16 | Ht 75.0 in | Wt 228.8 lb

## 2017-04-18 DIAGNOSIS — Z0000 Encounter for general adult medical examination without abnormal findings: Secondary | ICD-10-CM | POA: Diagnosis not present

## 2017-04-18 DIAGNOSIS — R413 Other amnesia: Secondary | ICD-10-CM

## 2017-04-18 DIAGNOSIS — E118 Type 2 diabetes mellitus with unspecified complications: Secondary | ICD-10-CM

## 2017-04-18 DIAGNOSIS — R972 Elevated prostate specific antigen [PSA]: Secondary | ICD-10-CM

## 2017-04-18 DIAGNOSIS — E039 Hypothyroidism, unspecified: Secondary | ICD-10-CM

## 2017-04-18 DIAGNOSIS — D539 Nutritional anemia, unspecified: Secondary | ICD-10-CM | POA: Diagnosis not present

## 2017-04-18 DIAGNOSIS — E785 Hyperlipidemia, unspecified: Secondary | ICD-10-CM

## 2017-04-18 DIAGNOSIS — C61 Malignant neoplasm of prostate: Secondary | ICD-10-CM | POA: Insufficient documentation

## 2017-04-18 LAB — CBC WITH DIFFERENTIAL/PLATELET
BASOS ABS: 0.1 10*3/uL (ref 0.0–0.1)
BASOS PCT: 0.6 % (ref 0.0–3.0)
EOS ABS: 0.1 10*3/uL (ref 0.0–0.7)
Eosinophils Relative: 0.7 % (ref 0.0–5.0)
HEMATOCRIT: 42.2 % (ref 39.0–52.0)
HEMOGLOBIN: 14.3 g/dL (ref 13.0–17.0)
LYMPHS PCT: 21.2 % (ref 12.0–46.0)
Lymphs Abs: 1.8 10*3/uL (ref 0.7–4.0)
MCHC: 33.8 g/dL (ref 30.0–36.0)
MCV: 92.3 fl (ref 78.0–100.0)
Monocytes Absolute: 0.5 10*3/uL (ref 0.1–1.0)
Monocytes Relative: 5.3 % (ref 3.0–12.0)
NEUTROS ABS: 6.2 10*3/uL (ref 1.4–7.7)
Neutrophils Relative %: 72.2 % (ref 43.0–77.0)
Platelets: 230 10*3/uL (ref 150.0–400.0)
RBC: 4.57 Mil/uL (ref 4.22–5.81)
RDW: 13.8 % (ref 11.5–15.5)
WBC: 8.5 10*3/uL (ref 4.0–10.5)

## 2017-04-18 LAB — IBC PANEL
Iron: 135 ug/dL (ref 42–165)
Saturation Ratios: 29 % (ref 20.0–50.0)
Transferrin: 332 mg/dL (ref 212.0–360.0)

## 2017-04-18 LAB — FERRITIN: Ferritin: 10.6 ng/mL — ABNORMAL LOW (ref 22.0–322.0)

## 2017-04-18 LAB — THYROID PANEL WITH TSH
FREE THYROXINE INDEX: 2.9 (ref 1.4–3.8)
T3 Uptake: 29 % (ref 22–35)
T4 TOTAL: 10.1 ug/dL (ref 4.9–10.5)
TSH: 4.66 m[IU]/L — AB (ref 0.40–4.50)

## 2017-04-18 LAB — FOLATE: Folate: >23.9 ng/mL (ref >5.9)

## 2017-04-18 LAB — VITAMIN B12: VITAMIN B 12: 806 pg/mL (ref 211–911)

## 2017-04-18 MED ORDER — SILDENAFIL CITRATE 20 MG PO TABS: ORAL_TABLET | ORAL | 3 refills | Status: DC

## 2017-04-18 MED ORDER — ATORVASTATIN CALCIUM 20 MG PO TABS: 20.0000 mg | ORAL_TABLET | Freq: Every day | ORAL | 3 refills | Status: DC

## 2017-04-18 MED ORDER — LEVOTHYROXINE SODIUM 50 MCG PO TABS: 50.0000 ug | ORAL_TABLET | Freq: Every day | ORAL | 0 refills | Status: DC

## 2017-04-18 NOTE — Telephone Encounter (Signed)
-----   Message from [facility], [place] sent at 04/18/2017  8:40 AM EST ----- Regarding: Shingrix Wait List Can you please add this patient to the wait list.

## 2017-04-18 NOTE — Progress Notes (Signed)
Subjective:  Patient ID: Kenneth Shaw, male    DOB: 1955/12/01  Age: 61 y.o. MRN: [id number]  CC: Annual Exam; Hyperlipidemia; and Diabetes  NEW TO ME  HPI [name] presents for a CPX.  He complains of fatigue, feeling cold all the time, constipation, and weight gain.  He recently had labs that showed a mildly elevated TSH.  He also complains of a decline in his memory and forgetfulness.  He has a significant family history of dementia.  He has a history of diabetes mellitus and takes metformin but his recent A1c was down to 6.1%.  Recent labs also showed he was very mildly anemic.  He has rare blood on the toilet paper associated with anal hemorrhoids.  He has had an unremarkable colonoscopy within the last year.  Outpatient Medications Prior to Visit  Medication Sig Dispense Refill  . aspirin 81 MG tablet Take 81 mg by mouth daily.     . Cholecalciferol (VITAMIN D-3) 1000 UNITS CAPS Take by mouth 2 (two) times daily.    . diphenhydrAMINE (BENADRYL) 25 MG tablet Take 25 mg by mouth at bedtime as needed.    . Multiple Vitamin (MULTIVITAMIN) tablet Take 1 tablet by mouth daily.     . NON FORMULARY BIPAP MACHINE    . atorvastatin (LIPITOR) 20 MG tablet Take 1 tablet (20 mg total) by mouth daily. 90 tablet 3  . metFORMIN (GLUCOPHAGE) 500 MG tablet Take 1 tablet (500 mg total) by mouth 2 (two) times daily with a meal. 180 tablet 3  . sildenafil (REVATIO) 20 MG tablet 2-5 qd prn 50 tablet 3  . Diclofenac Sodium (PENNSAID) 2 % SOLN Place 1 application onto the skin 2 (two) times daily as needed. 112 g 1  . Ibuprofen-Famotidine 800-26.6 MG TABS Take 1 tablet by mouth 3 (three) times daily as needed. 90 tablet 1  . tiZANidine (ZANAFLEX) 4 MG tablet Take 1 tablet (4 mg total) by mouth every 6 (six) hours as needed for muscle spasms. 30 tablet 0   No facility-administered medications prior to visit.     ROS Review of Systems  Constitutional: Positive for fatigue and unexpected weight  change. Negative for appetite change, chills, diaphoresis and fever.  HENT: Negative.  Negative for facial swelling and trouble swallowing.   Eyes: Negative for visual disturbance.  Respiratory: Negative.  Negative for cough, chest tightness, shortness of breath, wheezing and stridor.   Cardiovascular: Negative.  Negative for chest pain, palpitations and leg swelling.  Gastrointestinal: Positive for anal bleeding and constipation. Negative for abdominal distention, abdominal pain, blood in stool, diarrhea, nausea, rectal pain and vomiting.  Endocrine: Positive for cold intolerance. Negative for heat intolerance.  Genitourinary: Negative.  Negative for difficulty urinating, dysuria, frequency, penile swelling, scrotal swelling, testicular pain and urgency.  Musculoskeletal: Positive for arthralgias. Negative for myalgias.  Skin: Negative.  Negative for color change.  Allergic/Immunologic: Negative.   Neurological: Negative for dizziness, weakness, light-headedness and numbness.  Hematological: Negative for adenopathy. Does not bruise/bleed easily.  Psychiatric/Behavioral: Positive for decreased concentration. Negative for sleep disturbance and suicidal ideas. The patient is not nervous/anxious.     Objective:  BP 112/72 (BP Location: Left Arm, Patient Position: Sitting, Cuff Size: Normal)   Pulse 72   Temp 98.3 F (36.8 C) (Oral)   Resp 16   Ht 6\' 3"  (1.905 m)   Wt 228 lb 12 oz (103.8 kg)   SpO2 98%   BMI 28.59 kg/m   BP Readings  from Last 3 Encounters:  04/18/17 112/72  06/21/16 130/80  04/06/16 136/90    Wt Readings from Last 3 Encounters:  04/18/17 228 lb 12 oz (103.8 kg)  06/21/16 221 lb (100.2 kg)  04/06/16 217 lb (98.4 kg)    Physical Exam  Constitutional: No distress.  HENT:  Mouth/Throat: Oropharynx is clear and moist. No oropharyngeal exudate.  Eyes: Conjunctivae are normal. Right eye exhibits no discharge. Left eye exhibits no discharge. No scleral icterus.    Neck: Normal range of motion. Neck supple. No JVD present. No thyromegaly present.  Cardiovascular: Normal rate, regular rhythm and intact distal pulses. Exam reveals no gallop and no friction rub.  No murmur heard. Pulmonary/Chest: Effort normal and breath sounds normal. No respiratory distress. He has no wheezes. He has no rales. He exhibits no tenderness.  Abdominal: Soft. Bowel sounds are normal. He exhibits no distension and no mass. There is no tenderness. There is no rebound and no guarding. Hernia confirmed negative in the right inguinal area and confirmed negative in the left inguinal area.  Genitourinary: Testes normal and penis normal. Rectal exam shows internal hemorrhoid. Rectal exam shows no external hemorrhoid, no fissure, no mass, no tenderness, anal tone normal and guaiac negative stool. Prostate is enlarged (1+ BPH Rt lobe>left lobe). Prostate is not tender. Right testis shows no mass, no swelling and no tenderness. Right testis is descended. Left testis shows no mass, no swelling and no tenderness. Left testis is descended. Circumcised. No penile erythema or penile tenderness. No discharge found.  Lymphadenopathy:       Right: No inguinal adenopathy present.       Left: No inguinal adenopathy present.  Skin: He is not diaphoretic.  Vitals reviewed.   Lab Results  Component Value Date   WBC 8.5 04/18/2017   HGB 14.3 04/18/2017   HCT 42.2 04/18/2017   PLT 230.0 04/18/2017   GLUCOSE 118 (H) 04/05/2017   CHOL 135 04/05/2017   TRIG 52.0 04/05/2017   HDL 49.00 04/05/2017   LDLDIRECT 137.2 04/16/2013   LDLCALC 76 04/05/2017   ALT 15 04/05/2017   AST 18 04/05/2017   NA 143 04/05/2017   K 4.5 04/05/2017   CL 107 04/05/2017   CREATININE 0.94 04/05/2017   BUN 22 04/05/2017   CO2 28 04/05/2017   TSH 4.66 (H) 04/18/2017   PSA 4.17 (H) 04/05/2017   INR 0.92 04/13/2012   HGBA1C 6.1 04/05/2017   MICROALBUR 1.1 04/05/2017    No results found.  Assessment & Plan:    Quenton was seen today for annual exam, hyperlipidemia and diabetes.  Diagnoses and all orders for this visit:  Memory loss- I have asked him to see neurology about this. -     Ambulatory referral to Neurology  Acquired hypothyroidism- He has a chronic, stable increase in his TSH and a history of multinodular goiter.  His thyroid gland feels normal today.  He has quite a few symptoms so I have asked him to start low-dose levothyroxine replacement to see if it will help him feel better. -     levothyroxine (SYNTHROID, LEVOTHROID) 50 MCG tablet; Take 1 tablet (50 mcg total) daily by mouth. -     Thyroid Panel With TSH; Future  Deficiency anemia-his H&H are normal now.  His iron level is normal but his ferritin is low -this is an insignificant finding.  His B12 and folate levels are normal. -     CBC with Differential/Platelet; Future -  IBC panel; Future -     Ferritin; Future -     Folate; Future -     Vitamin B12; Future  PSA elevation- His PSA has risen slightly over the last year.  I have asked him to return in 3-4 months for recheck of this.  If it continues to rise then we will consider referral to urology to evaluate for possible biopsy but at this time I am not overly concerned about prostate cancer.  He has no symptoms that need to be treated.  Routine general medical examination at a health care facility- Exam completed, labs reviewed, he refused the flu vaccine today, screening for colon cancer is up-to-date, patient education material was given.  Other orders -     sildenafil (REVATIO) 20 MG tablet; 2-5 qd prn -     atorvastatin (LIPITOR) 20 MG tablet; Take 1 tablet (20 mg total) daily by mouth.   I have discontinued Jamareon Shimel. Astorga's metFORMIN, tiZANidine, Diclofenac Sodium, and Ibuprofen-Famotidine. I have also changed his atorvastatin. Additionally, I am having him start on levothyroxine. Lastly, I am having him maintain his aspirin, multivitamin, Vitamin D-3, NON  FORMULARY, diphenhydrAMINE, and sildenafil.  Meds ordered this encounter  Medications  . levothyroxine (SYNTHROID, LEVOTHROID) 50 MCG tablet    Sig: Take 1 tablet (50 mcg total) daily by mouth.    Dispense:  90 tablet    Refill:  0  . sildenafil (REVATIO) 20 MG tablet    Sig: 2-5 qd prn    Dispense:  50 tablet    Refill:  3  . atorvastatin (LIPITOR) 20 MG tablet    Sig: Take 1 tablet (20 mg total) daily by mouth.    Dispense:  90 tablet    Refill:  3     Follow-up: Return in about 3 months (around 07/19/2017).  Scarlette Calico, MD

## 2017-04-18 NOTE — Telephone Encounter (Signed)
Patient added to shingrix waitlist

## 2017-04-18 NOTE — Patient Instructions (Signed)
Hypothyroidism Hypothyroidism is a disorder of the thyroid. The thyroid is a large gland that is located in the lower front of the neck. The thyroid releases hormones that control how the body works. With hypothyroidism, the thyroid does not make enough of these hormones. What are the causes? Causes of hypothyroidism may include:  Viral infections.  Pregnancy.  Your own defense system (immune system) attacking your thyroid.  Certain medicines.  Birth defects.  Past radiation treatments to your head or neck.  Past treatment with radioactive iodine.  Past surgical removal of part or all of your thyroid.  Problems with the gland that is located in the center of your brain (pituitary).  What are the signs or symptoms? Signs and symptoms of hypothyroidism may include:  Feeling as though you have no energy (lethargy).  Inability to tolerate cold.  Weight gain that is not explained by a change in diet or exercise habits.  Dry skin.  Coarse hair.  Menstrual irregularity.  Slowing of thought processes.  Constipation.  Sadness or depression.  How is this diagnosed? Your health care provider may diagnose hypothyroidism with blood tests and ultrasound tests. How is this treated? Hypothyroidism is treated with medicine that replaces the hormones that your body does not make. After you begin treatment, it may take several weeks for symptoms to go away. Follow these instructions at home:  Take medicines only as directed by your health care provider.  If you start taking any new medicines, tell your health care provider.  Keep all follow-up visits as directed by your health care provider. This is important. As your condition improves, your dosage needs may change. You will need to have blood tests regularly so that your health care provider can watch your condition. Contact a health care provider if:  Your symptoms do not get better with treatment.  You are taking thyroid  replacement medicine and: ? You sweat excessively. ? You have tremors. ? You feel anxious. ? You lose weight rapidly. ? You cannot tolerate heat. ? You have emotional swings. ? You have diarrhea. ? You feel weak. Get help right away if:  You develop chest pain.  You develop an irregular heartbeat.  You develop a rapid heartbeat. This information is not intended to replace advice given to you by your health care provider. Make sure you discuss any questions you have with your health care provider. Document Released: 05/23/2005 Document Revised: 10/29/2015 Document Reviewed: 10/08/2013 Elsevier Interactive Patient Education  2017 [employer] Inc.  

## 2017-04-19 ENCOUNTER — Encounter: Payer: Self-pay | Admitting: Internal Medicine

## 2017-04-19 NOTE — Assessment & Plan Note (Signed)
He has achieved his LDL goal and is doing well on the statin 

## 2017-04-19 NOTE — Assessment & Plan Note (Signed)
His A1c is down to 6.1% so I have recommended that he stop taking metformin.

## 2017-04-21 ENCOUNTER — Encounter: Payer: Self-pay | Admitting: Neurology

## 2017-05-25 ENCOUNTER — Telehealth: Payer: Self-pay

## 2017-05-25 NOTE — Telephone Encounter (Signed)
Left message asking patient to call back to schedule nurse visit to get first shingrix vaccine---if appt is made, let tamara, RN at [facility] office know so that vaccines can be labeled and placed in refrig

## 2017-05-31 ENCOUNTER — Other Ambulatory Visit: Payer: Self-pay | Admitting: Internal Medicine

## 2017-05-31 ENCOUNTER — Encounter: Payer: Self-pay | Admitting: Internal Medicine

## 2017-05-31 DIAGNOSIS — N200 Calculus of kidney: Secondary | ICD-10-CM

## 2017-05-31 DIAGNOSIS — M109 Gout, unspecified: Secondary | ICD-10-CM

## 2017-06-01 ENCOUNTER — Encounter: Payer: Self-pay | Admitting: Internal Medicine

## 2017-06-01 ENCOUNTER — Other Ambulatory Visit (INDEPENDENT_AMBULATORY_CARE_PROVIDER_SITE_OTHER): Payer: BLUE CROSS/BLUE SHIELD

## 2017-06-01 DIAGNOSIS — N200 Calculus of kidney: Secondary | ICD-10-CM

## 2017-06-01 DIAGNOSIS — M109 Gout, unspecified: Secondary | ICD-10-CM | POA: Diagnosis not present

## 2017-06-01 LAB — URINALYSIS WITH CULTURE, IF INDICATED
Bilirubin Urine: NEGATIVE
Hgb urine dipstick: NEGATIVE
KETONES UR: NEGATIVE
LEUKOCYTES UA: NEGATIVE
Nitrite: NEGATIVE
PH: 5.5 (ref 5.0–8.0)
Specific Gravity, Urine: 1.025 (ref 1.000–1.030)
TOTAL PROTEIN, URINE-UPE24: NEGATIVE
UROBILINOGEN UA: 0.2 (ref 0.0–1.0)
Urine Glucose: NEGATIVE

## 2017-06-01 LAB — URIC ACID: URIC ACID, SERUM: 5.7 mg/dL (ref 4.0–7.8)

## 2017-06-01 NOTE — Telephone Encounter (Signed)
Lab orders entered. Pt in lab now.

## 2017-06-09 ENCOUNTER — Encounter: Payer: Self-pay | Admitting: Internal Medicine

## 2017-06-13 ENCOUNTER — Telehealth: Payer: Self-pay

## 2017-06-13 NOTE — Telephone Encounter (Signed)
Left message advising patient that he can schedule to have his first shingrix vaccine if he is ready to get started---patient is already coming in on 2/12 to see dr Ronnald Ramp (office visit) and this vaccine can be given in that office visit if he is ok with waiting until then---if he wants to get started now, he can schedule nurse visit before that time to get first vaccine--if patient calls back and is ready to get first vaccine, either nurse of office visit, please let tamara,RN at [facility] office know so that vaccines can be labeled with patient's name---any questions, can talk with Tamara,RN at [facility] office

## 2017-07-09 ENCOUNTER — Encounter: Payer: Self-pay | Admitting: Internal Medicine

## 2017-07-10 ENCOUNTER — Other Ambulatory Visit: Payer: Self-pay | Admitting: Internal Medicine

## 2017-07-10 DIAGNOSIS — D539 Nutritional anemia, unspecified: Secondary | ICD-10-CM

## 2017-07-10 DIAGNOSIS — E118 Type 2 diabetes mellitus with unspecified complications: Secondary | ICD-10-CM

## 2017-07-10 DIAGNOSIS — E785 Hyperlipidemia, unspecified: Secondary | ICD-10-CM

## 2017-07-10 DIAGNOSIS — E039 Hypothyroidism, unspecified: Secondary | ICD-10-CM

## 2017-07-10 DIAGNOSIS — R972 Elevated prostate specific antigen [PSA]: Secondary | ICD-10-CM

## 2017-07-10 MED ORDER — LEVOTHYROXINE SODIUM 50 MCG PO TABS: 50.0000 ug | ORAL_TABLET | Freq: Every day | ORAL | 0 refills | Status: DC

## 2017-07-11 ENCOUNTER — Other Ambulatory Visit (INDEPENDENT_AMBULATORY_CARE_PROVIDER_SITE_OTHER): Payer: BLUE CROSS/BLUE SHIELD

## 2017-07-11 DIAGNOSIS — D539 Nutritional anemia, unspecified: Secondary | ICD-10-CM | POA: Diagnosis not present

## 2017-07-11 DIAGNOSIS — E118 Type 2 diabetes mellitus with unspecified complications: Secondary | ICD-10-CM | POA: Diagnosis not present

## 2017-07-11 DIAGNOSIS — E039 Hypothyroidism, unspecified: Secondary | ICD-10-CM | POA: Diagnosis not present

## 2017-07-11 DIAGNOSIS — E785 Hyperlipidemia, unspecified: Secondary | ICD-10-CM | POA: Diagnosis not present

## 2017-07-11 DIAGNOSIS — R972 Elevated prostate specific antigen [PSA]: Secondary | ICD-10-CM | POA: Diagnosis not present

## 2017-07-11 LAB — BASIC METABOLIC PANEL
BUN: 19 mg/dL (ref 6–23)
CHLORIDE: 106 meq/L (ref 96–112)
CO2: 29 mEq/L (ref 19–32)
CREATININE: 1.07 mg/dL (ref 0.40–1.50)
Calcium: 9.3 mg/dL (ref 8.4–10.5)
GFR: 74.32 mL/min (ref >60.00)
GLUCOSE: 111 mg/dL — AB (ref 70–99)
Potassium: 4.6 mEq/L (ref 3.5–5.1)
Sodium: 141 mEq/L (ref 135–145)

## 2017-07-11 LAB — LIPID PANEL
CHOLESTEROL: 201 mg/dL — AB (ref 0–200)
HDL: 58.3 mg/dL (ref >39.00)
LDL CALC: 124 mg/dL — AB (ref 0–99)
NonHDL: 143.1
TRIGLYCERIDES: 97 mg/dL (ref 0.0–149.0)
Total CHOL/HDL Ratio: 3
VLDL: 19.4 mg/dL (ref 0.0–40.0)

## 2017-07-11 LAB — CBC WITH DIFFERENTIAL/PLATELET
BASOS PCT: 0.6 % (ref 0.0–3.0)
Basophils Absolute: 0 10*3/uL (ref 0.0–0.1)
EOS ABS: 0.1 10*3/uL (ref 0.0–0.7)
Eosinophils Relative: 1.1 % (ref 0.0–5.0)
HCT: 42.3 % (ref 39.0–52.0)
HEMOGLOBIN: 14.5 g/dL (ref 13.0–17.0)
LYMPHS ABS: 1.6 10*3/uL (ref 0.7–4.0)
Lymphocytes Relative: 28.4 % (ref 12.0–46.0)
MCHC: 34.2 g/dL (ref 30.0–36.0)
MCV: 90.6 fl (ref 78.0–100.0)
MONO ABS: 0.3 10*3/uL (ref 0.1–1.0)
Monocytes Relative: 5.8 % (ref 3.0–12.0)
NEUTROS PCT: 64.1 % (ref 43.0–77.0)
Neutro Abs: 3.7 10*3/uL (ref 1.4–7.7)
Platelets: 195 10*3/uL (ref 150.0–400.0)
RBC: 4.67 Mil/uL (ref 4.22–5.81)
RDW: 14.6 % (ref 11.5–15.5)
WBC: 5.7 10*3/uL (ref 4.0–10.5)

## 2017-07-11 LAB — TSH: TSH: 3.49 u[IU]/mL (ref 0.35–4.50)

## 2017-07-11 LAB — HEMOGLOBIN A1C: HEMOGLOBIN A1C: 6.1 % (ref 4.6–6.5)

## 2017-07-11 LAB — PSA: PSA: 4.66 ng/mL — AB (ref 0.10–4.00)

## 2017-07-14 ENCOUNTER — Encounter: Payer: Self-pay | Admitting: Internal Medicine

## 2017-07-18 ENCOUNTER — Encounter: Payer: Self-pay | Admitting: Internal Medicine

## 2017-07-18 ENCOUNTER — Ambulatory Visit (INDEPENDENT_AMBULATORY_CARE_PROVIDER_SITE_OTHER): Payer: BLUE CROSS/BLUE SHIELD | Admitting: Internal Medicine

## 2017-07-18 VITALS — BP 120/78 | HR 73 | Temp 97.9°F | Resp 16 | Ht 75.0 in | Wt 226.0 lb

## 2017-07-18 DIAGNOSIS — E118 Type 2 diabetes mellitus with unspecified complications: Secondary | ICD-10-CM

## 2017-07-18 DIAGNOSIS — R972 Elevated prostate specific antigen [PSA]: Secondary | ICD-10-CM | POA: Diagnosis not present

## 2017-07-18 DIAGNOSIS — E039 Hypothyroidism, unspecified: Secondary | ICD-10-CM | POA: Diagnosis not present

## 2017-07-18 DIAGNOSIS — Z299 Encounter for prophylactic measures, unspecified: Secondary | ICD-10-CM | POA: Diagnosis not present

## 2017-07-18 DIAGNOSIS — E785 Hyperlipidemia, unspecified: Secondary | ICD-10-CM | POA: Diagnosis not present

## 2017-07-18 MED ORDER — ATORVASTATIN CALCIUM 20 MG PO TABS: 20.0000 mg | ORAL_TABLET | Freq: Every day | ORAL | 1 refills | Status: DC

## 2017-07-18 NOTE — Progress Notes (Signed)
Subjective:  Patient ID: Kenneth Shaw, male    DOB: May 28, 1956  Age: 62 y.o. MRN: [id number]  CC: Hyperlipidemia; Hypothyroidism; and Diabetes   HPI Kenneth Shaw presents for f/up - He feels well today and offers no complaints.  He has had no recent episodes of fatigue, edema, palpitations, chest pain, or DOE.  He has not been taking the statin.  It did not cause any side effects but he just decided to stop taking it about 6 or 8 weeks ago.  Outpatient Medications Prior to Visit  Medication Sig Dispense Refill  . aspirin 81 MG tablet Take 81 mg by mouth daily.     . Cholecalciferol (VITAMIN D-3) 1000 UNITS CAPS Take by mouth 2 (two) times daily.    Marland Kitchen levothyroxine (SYNTHROID, LEVOTHROID) 50 MCG tablet Take 1 tablet (50 mcg total) by mouth daily. 30 tablet 0  . Multiple Vitamin (MULTIVITAMIN) tablet Take 1 tablet by mouth daily.     . NON FORMULARY BIPAP MACHINE    . diphenhydrAMINE (BENADRYL) 25 MG tablet Take 25 mg by mouth at bedtime as needed.    . sildenafil (REVATIO) 20 MG tablet 2-5 qd prn (Patient not taking: Reported on 07/18/2017) 50 tablet 3  . atorvastatin (LIPITOR) 20 MG tablet Take 1 tablet (20 mg total) daily by mouth. (Patient not taking: Reported on 07/18/2017) 90 tablet 3   No facility-administered medications prior to visit.     ROS Review of Systems  Constitutional: Negative for appetite change, diaphoresis, fatigue and unexpected weight change.  HENT: Negative.   Eyes: Negative for visual disturbance.  Respiratory: Negative.  Negative for cough, chest tightness, shortness of breath and wheezing.   Cardiovascular: Negative for chest pain, palpitations and leg swelling.  Gastrointestinal: Negative for abdominal pain, diarrhea, nausea and vomiting.  Endocrine: Negative for cold intolerance and heat intolerance.  Genitourinary: Negative.  Negative for decreased urine volume, difficulty urinating, frequency, penile pain and urgency.  Musculoskeletal: Negative.   Negative for arthralgias and myalgias.  Skin: Negative.  Negative for color change and pallor.  Allergic/Immunologic: Negative.   Neurological: Negative.  Negative for dizziness, weakness, light-headedness and headaches.  Hematological: Negative for adenopathy. Does not bruise/bleed easily.  Psychiatric/Behavioral: Negative.     Objective:  BP 120/78 (BP Location: Left Arm, Patient Position: Sitting, Cuff Size: Normal)   Pulse 73   Temp 97.9 F (36.6 C) (Oral)   Resp 16   Ht 6\' 3"  (1.905 m)   Wt 226 lb (102.5 kg)   SpO2 96%   BMI 28.25 kg/m   BP Readings from Last 3 Encounters:  07/18/17 120/78  04/18/17 112/72  06/21/16 130/80    Wt Readings from Last 3 Encounters:  07/18/17 226 lb (102.5 kg)  04/18/17 228 lb 12 oz (103.8 kg)  06/21/16 221 lb (100.2 kg)    Physical Exam  Constitutional: He is oriented to person, place, and time. No distress.  HENT:  Mouth/Throat: Oropharynx is clear and moist. No oropharyngeal exudate.  Eyes: Conjunctivae are normal. Left eye exhibits no discharge. No scleral icterus.  Neck: Neck supple. No JVD present. No thyromegaly present.  Cardiovascular: Normal rate, regular rhythm and normal heart sounds. Exam reveals no gallop.  No murmur heard. Pulmonary/Chest: Effort normal and breath sounds normal. No respiratory distress. He has no wheezes. He has no rales.  Abdominal: Soft. Bowel sounds are normal. He exhibits no mass. There is no tenderness.  Musculoskeletal: Normal range of motion. He exhibits no edema, tenderness or  deformity.  Lymphadenopathy:    He has no cervical adenopathy.  Neurological: He is alert and oriented to person, place, and time.  Skin: Skin is warm and dry. No rash noted. He is not diaphoretic. No erythema. No pallor.  Vitals reviewed.   Lab Results  Component Value Date   WBC 5.7 07/11/2017   HGB 14.5 07/11/2017   HCT 42.3 07/11/2017   PLT 195.0 07/11/2017   GLUCOSE 111 (H) 07/11/2017   CHOL 201 (H) 07/11/2017    TRIG 97.0 07/11/2017   HDL 58.30 07/11/2017   LDLDIRECT 137.2 04/16/2013   LDLCALC 124 (H) 07/11/2017   ALT 15 04/05/2017   AST 18 04/05/2017   NA 141 07/11/2017   K 4.6 07/11/2017   CL 106 07/11/2017   CREATININE 1.07 07/11/2017   BUN 19 07/11/2017   CO2 29 07/11/2017   TSH 3.49 07/11/2017   PSA 4.66 (H) 07/11/2017   INR 0.92 04/13/2012   HGBA1C 6.1 07/11/2017   MICROALBUR 1.1 04/05/2017    No results found.  Assessment & Plan:   Kenneth Shaw was seen today for hyperlipidemia, hypothyroidism and diabetes.  Diagnoses and all orders for this visit:  Acquired hypothyroidism-  His TSH is in the normal range.  Will continue the current dose of levothyroxine.  Type 2 diabetes mellitus with complication, without long-term current use of insulin ([place])- His blood sugar is well controlled with no medication.  He was praised for improving his lifestyle modifications.  PSA elevation- I have asked him to see urology for workup of this. -     Ambulatory referral to Urology  Hyperlipidemia LDL goal <100- His ASCVD risk score is elevated at 15.3%.  I have asked him to restart the statin for CV risk reduction. -     atorvastatin (LIPITOR) 20 MG tablet; Take 1 tablet (20 mg total) by mouth daily.  Need for prophylactic measure -     Varicella-zoster vaccine IM (Shingrix)   I have discontinued Kenneth Shaw's diphenhydrAMINE. I have also changed his atorvastatin. Additionally, I am having him maintain his aspirin, multivitamin, Vitamin D-3, NON FORMULARY, sildenafil, and levothyroxine.  Meds ordered this encounter  Medications  . atorvastatin (LIPITOR) 20 MG tablet    Sig: Take 1 tablet (20 mg total) by mouth daily.    Dispense:  90 tablet    Refill:  1     Follow-up: Return in about 6 months (around 01/15/2018).  Kenneth Calico, MD

## 2017-07-18 NOTE — Patient Instructions (Signed)
High Cholesterol High cholesterol is a condition in which the blood has high levels of a Weltman, waxy, fat-like substance (cholesterol). The human body needs small amounts of cholesterol. The liver makes all the cholesterol that the body needs. Extra (excess) cholesterol comes from the food that we eat. Cholesterol is carried from the liver by the blood through the blood vessels. If you have high cholesterol, deposits (plaques) may build up on the walls of your blood vessels (arteries). Plaques make the arteries narrower and stiffer. Cholesterol plaques increase your risk for heart attack and stroke. Work with your health care provider to keep your cholesterol levels in a healthy range. What increases the risk? This condition is more likely to develop in people who:  Eat foods that are high in animal fat (saturated fat) or cholesterol.  Are overweight.  Are not getting enough exercise.  Have a family history of high cholesterol.  What are the signs or symptoms? There are no symptoms of this condition. How is this diagnosed? This condition may be diagnosed from the results of a blood test.  If you are older than age 20, your health care provider may check your cholesterol every 4-6 years.  You may be checked more often if you already have high cholesterol or other risk factors for heart disease.  The blood test for cholesterol measures:  "Bad" cholesterol (LDL cholesterol). This is the main type of cholesterol that causes heart disease. The desired level for LDL is less than 100.  "Good" cholesterol (HDL cholesterol). This type helps to protect against heart disease by cleaning the arteries and carrying the LDL away. The desired level for HDL is 60 or higher.  Triglycerides. These are fats that the body can store or burn for energy. The desired number for triglycerides is lower than 150.  Total cholesterol. This is a measure of the total amount of cholesterol in your blood, including LDL  cholesterol, HDL cholesterol, and triglycerides. A healthy number is less than 200.  How is this treated? This condition is treated with diet changes, lifestyle changes, and medicines. Diet changes  This may include eating more whole grains, fruits, vegetables, nuts, and fish.  This may also include cutting back on red meat and foods that have a lot of added sugar. Lifestyle changes  Changes may include getting at least 40 minutes of aerobic exercise 3 times a week. Aerobic exercises include walking, biking, and swimming. Aerobic exercise along with a healthy diet can help you maintain a healthy weight.  Changes may also include quitting smoking. Medicines  Medicines are usually given if diet and lifestyle changes have failed to reduce your cholesterol to healthy levels.  Your health care provider may prescribe a statin medicine. Statin medicines have been shown to reduce cholesterol, which can reduce the risk of heart disease. Follow these instructions at home: Eating and drinking  If told by your health care provider:  Eat chicken (without skin), fish, veal, shellfish, ground turkey breast, and round or loin cuts of red meat.  Do not eat fried foods or fatty meats, such as hot dogs and salami.  Eat plenty of fruits, such as apples.  Eat plenty of vegetables, such as broccoli, potatoes, and carrots.  Eat beans, peas, and lentils.  Eat grains such as barley, rice, couscous, and bulgur wheat.  Eat pasta without cream sauces.  Use skim or nonfat milk, and eat low-fat or nonfat yogurt and cheeses.  Do not eat or drink whole milk, cream, ice   cream, egg yolks, or hard cheeses.  Do not eat stick margarine or tub margarines that contain trans fats (also called partially hydrogenated oils).  Do not eat saturated tropical oils, such as coconut oil and palm oil.  Do not eat cakes, cookies, crackers, or other baked goods that contain trans fats.  General instructions  Exercise  as directed by your health care provider. Increase your activity level with activities such as gardening, walking, and taking the stairs.  Take over-the-counter and prescription medicines only as told by your health care provider.  Do not use any products that contain nicotine or tobacco, such as cigarettes and e-cigarettes. If you need help quitting, ask your health care provider.  Keep all follow-up visits as told by your health care provider. This is important. Contact a health care provider if:  You are struggling to maintain a healthy diet or weight.  You need help to start on an exercise program.  You need help to stop smoking. Get help right away if:  You have chest pain.  You have trouble breathing. This information is not intended to replace advice given to you by your health care provider. Make sure you discuss any questions you have with your health care provider. Document Released: 05/23/2005 Document Revised: 12/19/2015 Document Reviewed: 11/21/2015 Elsevier Interactive Patient Education  2018 [employer]  

## 2017-07-24 ENCOUNTER — Encounter: Payer: Self-pay | Admitting: Internal Medicine

## 2017-07-25 DIAGNOSIS — R972 Elevated prostate specific antigen [PSA]: Secondary | ICD-10-CM | POA: Diagnosis not present

## 2017-07-25 DIAGNOSIS — N202 Calculus of kidney with calculus of ureter: Secondary | ICD-10-CM | POA: Diagnosis not present

## 2017-07-25 DIAGNOSIS — N486 Induration penis plastica: Secondary | ICD-10-CM | POA: Diagnosis not present

## 2017-07-25 DIAGNOSIS — N5201 Erectile dysfunction due to arterial insufficiency: Secondary | ICD-10-CM | POA: Diagnosis not present

## 2017-07-25 LAB — PSA: PSA: 4.6

## 2017-08-01 ENCOUNTER — Ambulatory Visit: Payer: BLUE CROSS/BLUE SHIELD | Admitting: Neurology

## 2017-08-07 DIAGNOSIS — N202 Calculus of kidney with calculus of ureter: Secondary | ICD-10-CM | POA: Diagnosis not present

## 2017-08-07 DIAGNOSIS — R972 Elevated prostate specific antigen [PSA]: Secondary | ICD-10-CM | POA: Diagnosis not present

## 2017-08-10 ENCOUNTER — Other Ambulatory Visit: Payer: Self-pay | Admitting: Internal Medicine

## 2017-08-10 DIAGNOSIS — E039 Hypothyroidism, unspecified: Secondary | ICD-10-CM

## 2017-08-17 DIAGNOSIS — M720 Palmar fascial fibromatosis [Dupuytren]: Secondary | ICD-10-CM | POA: Diagnosis not present

## 2017-08-29 DIAGNOSIS — C61 Malignant neoplasm of prostate: Secondary | ICD-10-CM | POA: Diagnosis not present

## 2017-08-29 DIAGNOSIS — N486 Induration penis plastica: Secondary | ICD-10-CM | POA: Diagnosis not present

## 2017-08-29 DIAGNOSIS — N202 Calculus of kidney with calculus of ureter: Secondary | ICD-10-CM | POA: Diagnosis not present

## 2017-09-27 ENCOUNTER — Ambulatory Visit (INDEPENDENT_AMBULATORY_CARE_PROVIDER_SITE_OTHER): Payer: BLUE CROSS/BLUE SHIELD | Admitting: Emergency Medicine

## 2017-09-27 DIAGNOSIS — Z23 Encounter for immunization: Secondary | ICD-10-CM

## 2017-10-10 ENCOUNTER — Ambulatory Visit: Payer: BLUE CROSS/BLUE SHIELD

## 2018-01-29 ENCOUNTER — Other Ambulatory Visit: Payer: Self-pay | Admitting: Internal Medicine

## 2018-01-29 DIAGNOSIS — E039 Hypothyroidism, unspecified: Secondary | ICD-10-CM

## 2018-02-06 DIAGNOSIS — E7800 Pure hypercholesterolemia, unspecified: Secondary | ICD-10-CM | POA: Diagnosis not present

## 2018-02-06 DIAGNOSIS — C61 Malignant neoplasm of prostate: Secondary | ICD-10-CM | POA: Diagnosis not present

## 2018-02-07 ENCOUNTER — Encounter: Payer: Self-pay | Admitting: Internal Medicine

## 2018-02-08 DIAGNOSIS — R310 Gross hematuria: Secondary | ICD-10-CM | POA: Diagnosis not present

## 2018-02-08 DIAGNOSIS — N201 Calculus of ureter: Secondary | ICD-10-CM | POA: Diagnosis not present

## 2018-02-08 DIAGNOSIS — R8271 Bacteriuria: Secondary | ICD-10-CM | POA: Diagnosis not present

## 2018-02-08 DIAGNOSIS — N200 Calculus of kidney: Secondary | ICD-10-CM | POA: Diagnosis not present

## 2018-02-13 DIAGNOSIS — C61 Malignant neoplasm of prostate: Secondary | ICD-10-CM | POA: Diagnosis not present

## 2018-02-13 DIAGNOSIS — N202 Calculus of kidney with calculus of ureter: Secondary | ICD-10-CM | POA: Diagnosis not present

## 2018-02-13 DIAGNOSIS — R3911 Hesitancy of micturition: Secondary | ICD-10-CM | POA: Diagnosis not present

## 2018-02-13 DIAGNOSIS — N486 Induration penis plastica: Secondary | ICD-10-CM | POA: Diagnosis not present

## 2018-02-13 DIAGNOSIS — N5201 Erectile dysfunction due to arterial insufficiency: Secondary | ICD-10-CM | POA: Diagnosis not present

## 2018-03-20 LAB — HM DIABETES EYE EXAM

## 2018-03-22 ENCOUNTER — Other Ambulatory Visit: Payer: Self-pay | Admitting: Internal Medicine

## 2018-03-22 ENCOUNTER — Encounter: Payer: Self-pay | Admitting: Internal Medicine

## 2018-03-22 DIAGNOSIS — E039 Hypothyroidism, unspecified: Secondary | ICD-10-CM

## 2018-03-22 DIAGNOSIS — E118 Type 2 diabetes mellitus with unspecified complications: Secondary | ICD-10-CM

## 2018-03-22 DIAGNOSIS — E785 Hyperlipidemia, unspecified: Secondary | ICD-10-CM

## 2018-03-22 DIAGNOSIS — R972 Elevated prostate specific antigen [PSA]: Secondary | ICD-10-CM

## 2018-03-26 ENCOUNTER — Other Ambulatory Visit: Payer: Self-pay | Admitting: Internal Medicine

## 2018-03-26 DIAGNOSIS — E785 Hyperlipidemia, unspecified: Secondary | ICD-10-CM

## 2018-04-12 ENCOUNTER — Other Ambulatory Visit (INDEPENDENT_AMBULATORY_CARE_PROVIDER_SITE_OTHER): Payer: BLUE CROSS/BLUE SHIELD

## 2018-04-12 DIAGNOSIS — E785 Hyperlipidemia, unspecified: Secondary | ICD-10-CM

## 2018-04-12 DIAGNOSIS — R972 Elevated prostate specific antigen [PSA]: Secondary | ICD-10-CM

## 2018-04-12 DIAGNOSIS — E118 Type 2 diabetes mellitus with unspecified complications: Secondary | ICD-10-CM | POA: Diagnosis not present

## 2018-04-12 LAB — COMPREHENSIVE METABOLIC PANEL
ALT: 14 U/L (ref 0–53)
AST: 16 U/L (ref 0–37)
Albumin: 4.4 g/dL (ref 3.5–5.2)
Alkaline Phosphatase: 47 U/L (ref 39–117)
BUN: 24 mg/dL — ABNORMAL HIGH (ref 6–23)
CHLORIDE: 106 meq/L (ref 96–112)
CO2: 29 meq/L (ref 19–32)
Calcium: 9.2 mg/dL (ref 8.4–10.5)
Creatinine, Ser: 0.99 mg/dL (ref 0.40–1.50)
GFR: 81.09 mL/min (ref >60.00)
GLUCOSE: 108 mg/dL — AB (ref 70–99)
Potassium: 4.1 mEq/L (ref 3.5–5.1)
SODIUM: 141 meq/L (ref 135–145)
Total Bilirubin: 0.5 mg/dL (ref 0.2–1.2)
Total Protein: 6.5 g/dL (ref 6.0–8.3)

## 2018-04-12 LAB — HEMOGLOBIN A1C: Hgb A1c MFr Bld: 5.6 % (ref 4.6–6.5)

## 2018-04-12 LAB — LIPID PANEL
Cholesterol: 213 mg/dL — ABNORMAL HIGH (ref 0–200)
HDL: 54.9 mg/dL (ref >39.00)
LDL CALC: 145 mg/dL — AB (ref 0–99)
NONHDL: 158.42
TRIGLYCERIDES: 68 mg/dL (ref 0.0–149.0)
Total CHOL/HDL Ratio: 4
VLDL: 13.6 mg/dL (ref 0.0–40.0)

## 2018-04-12 LAB — PSA: PSA: 4.3

## 2018-04-12 LAB — TSH: TSH: 3.68 u[IU]/mL (ref 0.35–4.50)

## 2018-04-13 LAB — PSA, TOTAL AND FREE
PSA, % FREE: 19 % — AB (ref >25)
PSA, FREE: 0.8 ng/mL
PSA, Total: 4.3 ng/mL — ABNORMAL HIGH (ref <=4.0)

## 2018-04-18 ENCOUNTER — Encounter: Payer: Self-pay | Admitting: Internal Medicine

## 2018-04-23 ENCOUNTER — Ambulatory Visit (INDEPENDENT_AMBULATORY_CARE_PROVIDER_SITE_OTHER): Payer: BLUE CROSS/BLUE SHIELD | Admitting: Internal Medicine

## 2018-04-23 ENCOUNTER — Encounter: Payer: Self-pay | Admitting: Internal Medicine

## 2018-04-23 ENCOUNTER — Other Ambulatory Visit: Payer: BLUE CROSS/BLUE SHIELD

## 2018-04-23 VITALS — BP 132/80 | HR 71 | Temp 98.2°F | Ht 75.0 in | Wt 222.8 lb

## 2018-04-23 DIAGNOSIS — C61 Malignant neoplasm of prostate: Secondary | ICD-10-CM

## 2018-04-23 DIAGNOSIS — E785 Hyperlipidemia, unspecified: Secondary | ICD-10-CM | POA: Diagnosis not present

## 2018-04-23 DIAGNOSIS — Z0000 Encounter for general adult medical examination without abnormal findings: Secondary | ICD-10-CM

## 2018-04-23 DIAGNOSIS — E039 Hypothyroidism, unspecified: Secondary | ICD-10-CM

## 2018-04-23 DIAGNOSIS — E118 Type 2 diabetes mellitus with unspecified complications: Secondary | ICD-10-CM

## 2018-04-23 MED ORDER — ATORVASTATIN CALCIUM 20 MG PO TABS: 20.0000 mg | ORAL_TABLET | Freq: Every day | ORAL | 1 refills | Status: DC

## 2018-04-23 NOTE — Patient Instructions (Signed)

## 2018-04-23 NOTE — Progress Notes (Signed)
Subjective:  Patient ID: Kenneth Shaw, male    DOB: 1956-01-19  Age: 62 y.o. MRN: [id number]  CC: Annual Exam and Hyperlipidemia   HPI Kenneth Shaw presents for a CPX.  He has been working diligently on his lifestyle modifications and over the last few years and has been able to lose weight.  He is very active and denies any recent episodes of CP, DOE, palpitations, edema, or fatigue.  He is not currently taking a statin.  He has known some people who developed cognitive decline on a statin so he had decided not to take one.  Outpatient Medications Prior to Visit  Medication Sig Dispense Refill  . aspirin 81 MG tablet Take 81 mg by mouth daily.     Marland Kitchen levothyroxine (SYNTHROID, LEVOTHROID) 50 MCG tablet TAKE 1 TABLET BY MOUTH ONCE DAILY 90 tablet 0  . Multiple Vitamin (MULTIVITAMIN) tablet Take 1 tablet by mouth daily.     . NON FORMULARY BIPAP MACHINE    . sildenafil (REVATIO) 20 MG tablet 2-5 qd prn 50 tablet 3  . tamsulosin (FLOMAX) 0.4 MG CAPS capsule     . atorvastatin (LIPITOR) 20 MG tablet Take 1 tablet (20 mg total) by mouth daily. 90 tablet 1  . Cholecalciferol (VITAMIN D-3) 1000 UNITS CAPS Take by mouth 2 (two) times daily.     No facility-administered medications prior to visit.     ROS Review of Systems  Constitutional: Negative.  Negative for appetite change, diaphoresis, fatigue and fever.  HENT: Negative.   Eyes: Negative for visual disturbance.  Respiratory: Negative for cough, chest tightness and shortness of breath.   Cardiovascular: Negative.  Negative for chest pain, palpitations and leg swelling.  Gastrointestinal: Negative for abdominal pain, constipation, diarrhea, nausea and vomiting.  Endocrine: Negative.   Genitourinary: Negative.  Negative for difficulty urinating, scrotal swelling, testicular pain and urgency.  Musculoskeletal: Negative.  Negative for arthralgias and myalgias.  Skin: Negative.  Negative for color change and pallor.  Neurological:  Negative.  Negative for dizziness, weakness, light-headedness and headaches.  Hematological: Negative for adenopathy. Does not bruise/bleed easily.  Psychiatric/Behavioral: Negative.     Objective:  BP 132/80 (BP Location: Left Arm, Patient Position: Sitting, Cuff Size: Normal)   Pulse 71   Temp 98.2 F (36.8 C) (Oral)   Ht 6\' 3"  (1.905 m)   Wt 222 lb 12 oz (101 kg)   SpO2 98%   BMI 27.84 kg/m   BP Readings from Last 3 Encounters:  04/23/18 132/80  07/18/17 120/78  04/18/17 112/72    Wt Readings from Last 3 Encounters:  04/23/18 222 lb 12 oz (101 kg)  07/18/17 226 lb (102.5 kg)  04/18/17 228 lb 12 oz (103.8 kg)    Physical Exam  Constitutional: He is oriented to person, place, and time. No distress.  HENT:  Mouth/Throat: Oropharynx is clear and moist. No oropharyngeal exudate.  Eyes: Conjunctivae are normal. No scleral icterus.  Neck: Neck supple. No JVD present. No thyromegaly present.  Cardiovascular: Normal rate, regular rhythm and normal heart sounds. Exam reveals no gallop.  No murmur heard. Pulmonary/Chest: Effort normal and breath sounds normal. No respiratory distress. He has no wheezes. He has no rales.  Abdominal: Soft. Bowel sounds are normal. He exhibits no mass. There is no hepatosplenomegaly. There is no tenderness. Hernia confirmed negative in the right inguinal area and confirmed negative in the left inguinal area.  Genitourinary: Testes normal and penis normal. Rectal exam shows external hemorrhoid and  internal hemorrhoid. Rectal exam shows no fissure, no mass, no tenderness, anal tone normal and guaiac negative stool. Prostate is enlarged (2+ symm BPH). Prostate is not tender. Right testis shows no mass, no swelling and no tenderness. Left testis shows no mass, no swelling and no tenderness. Circumcised. No penile erythema or penile tenderness. No discharge found.  Musculoskeletal: Normal range of motion. He exhibits no edema, tenderness or deformity.    Lymphadenopathy:    He has no cervical adenopathy. No inguinal adenopathy noted on the right or left side.  Neurological: He is alert and oriented to person, place, and time.  Skin: Skin is warm and dry. No rash noted. He is not diaphoretic.  Psychiatric: He has a normal mood and affect. His behavior is normal. Judgment and thought content normal.  Vitals reviewed.   Lab Results  Component Value Date   WBC 5.7 07/11/2017   HGB 14.5 07/11/2017   HCT 42.3 07/11/2017   PLT 195.0 07/11/2017   GLUCOSE 108 (H) 04/12/2018   CHOL 213 (H) 04/12/2018   TRIG 68.0 04/12/2018   HDL 54.90 04/12/2018   LDLDIRECT 137.2 04/16/2013   LDLCALC 145 (H) 04/12/2018   ALT 14 04/12/2018   AST 16 04/12/2018   NA 141 04/12/2018   K 4.1 04/12/2018   CL 106 04/12/2018   CREATININE 0.99 04/12/2018   BUN 24 (H) 04/12/2018   CO2 29 04/12/2018   TSH 3.68 04/12/2018   PSA 4.3 04/12/2018   INR 0.92 04/13/2012   HGBA1C 5.6 04/12/2018   MICROALBUR 1.1 04/05/2017    No results found.  Assessment & Plan:   Kenneth Shaw was seen today for annual exam and hyperlipidemia.  Diagnoses and all orders for this visit:  Prostate cancer [facility])- His PSA has not risen over the last 6 months which is reassuring that he does not have a recurrence of prostate cancer.  Hyperlipidemia LDL goal <100- He has an elevated ASCVD risk score so I have asked him to take a statin for CV risk reduction.  He agrees to comply with this at least every other day. -     NMR, lipoprofile; Future -     atorvastatin (LIPITOR) 20 MG tablet; Take 1 tablet (20 mg total) by mouth daily.  Routine general medical examination at a health care facility- Exam completed, labs reviewed, he refused vaccines for influenza and pneumonia, colon cancer screening is up-to-date, patient education material was given.  Acquired hypothyroidism- His TSH is in the normal range.  He will remain on the current dose of levothyroxine.  Type II diabetes mellitus with  manifestations ([place])- His blood sugars are very very well controlled with lifestyle modifications.  Medical therapy is not indicated.   I have discontinued Kenneth Shaw. Kenneth Shaw's Vitamin D-3. I am also having him maintain his aspirin, multivitamin, NON FORMULARY, sildenafil, levothyroxine, tamsulosin, and atorvastatin.  Meds ordered this encounter  Medications  . atorvastatin (LIPITOR) 20 MG tablet    Sig: Take 1 tablet (20 mg total) by mouth daily.    Dispense:  90 tablet    Refill:  1     Follow-up: Return if symptoms worsen or fail to improve.  Scarlette Calico, MD

## 2018-04-24 ENCOUNTER — Encounter: Payer: Self-pay | Admitting: Internal Medicine

## 2018-04-24 LAB — NMR, LIPOPROFILE
Cholesterol, Total: 224 mg/dL — ABNORMAL HIGH (ref 100–199)
HDL PARTICLE NUMBER: 35.8 umol/L (ref >=30.5)
HDL-C: 60 mg/dL (ref >39)
LDL Particle Number: 1874 nmol/L — ABNORMAL HIGH (ref <1000)
LDL Size: 20.9 nm (ref >20.5)
LDL-C: 151 mg/dL — AB (ref 0–99)
LP-IR SCORE: 46 — AB (ref <=45)
SMALL LDL PARTICLE NUMBER: 692 nmol/L — AB (ref <=527)
Triglycerides: 63 mg/dL (ref 0–149)

## 2018-04-26 ENCOUNTER — Encounter: Payer: Self-pay | Admitting: Internal Medicine

## 2018-04-26 NOTE — Progress Notes (Signed)
,  Outside notes received. Information abstracted. Notes sent to scan.

## 2018-04-28 ENCOUNTER — Other Ambulatory Visit: Payer: Self-pay | Admitting: Internal Medicine

## 2018-04-28 DIAGNOSIS — E039 Hypothyroidism, unspecified: Secondary | ICD-10-CM

## 2018-05-07 ENCOUNTER — Encounter: Payer: Self-pay | Admitting: Internal Medicine

## 2018-05-14 ENCOUNTER — Other Ambulatory Visit: Payer: Self-pay | Admitting: Internal Medicine

## 2018-05-14 DIAGNOSIS — E785 Hyperlipidemia, unspecified: Secondary | ICD-10-CM

## 2018-05-14 MED ORDER — PITAVASTATIN CALCIUM 2 MG PO TABS: 1.0000 | ORAL_TABLET | Freq: Every day | ORAL | 1 refills | Status: DC

## 2018-06-22 ENCOUNTER — Other Ambulatory Visit: Payer: Self-pay | Admitting: Urology

## 2018-06-22 DIAGNOSIS — Z1331 Encounter for screening for depression: Secondary | ICD-10-CM | POA: Diagnosis not present

## 2018-06-22 DIAGNOSIS — C61 Malignant neoplasm of prostate: Secondary | ICD-10-CM | POA: Diagnosis not present

## 2018-06-22 DIAGNOSIS — M179 Osteoarthritis of knee, unspecified: Secondary | ICD-10-CM | POA: Diagnosis not present

## 2018-06-22 DIAGNOSIS — M720 Palmar fascial fibromatosis [Dupuytren]: Secondary | ICD-10-CM | POA: Diagnosis not present

## 2018-06-22 DIAGNOSIS — R7309 Other abnormal glucose: Secondary | ICD-10-CM | POA: Diagnosis not present

## 2018-08-08 ENCOUNTER — Ambulatory Visit
Admission: RE | Admit: 2018-08-08 | Discharge: 2018-08-08 | Disposition: A | Discharge status: Home / Self Care | Payer: BLUE CROSS/BLUE SHIELD | Source: Ambulatory Visit | Attending: Urology | Admitting: Urology

## 2018-08-08 DIAGNOSIS — C61 Malignant neoplasm of prostate: Secondary | ICD-10-CM

## 2018-08-08 MED ORDER — GADOBENATE DIMEGLUMINE 529 MG/ML IV SOLN
20.0000 mL | Freq: Once | INTRAVENOUS | Status: AC | PRN
  Administered 2018-08-08: 20 mL via INTRAVENOUS

## 2018-08-14 DIAGNOSIS — C61 Malignant neoplasm of prostate: Secondary | ICD-10-CM | POA: Diagnosis not present

## 2018-08-20 ENCOUNTER — Other Ambulatory Visit: Payer: Self-pay | Admitting: Internal Medicine

## 2018-08-21 DIAGNOSIS — N5201 Erectile dysfunction due to arterial insufficiency: Secondary | ICD-10-CM | POA: Diagnosis not present

## 2018-08-21 DIAGNOSIS — N486 Induration penis plastica: Secondary | ICD-10-CM | POA: Diagnosis not present

## 2018-08-21 DIAGNOSIS — C61 Malignant neoplasm of prostate: Secondary | ICD-10-CM | POA: Diagnosis not present

## 2018-10-18 ENCOUNTER — Other Ambulatory Visit: Payer: Self-pay | Admitting: Endocrinology

## 2018-10-18 DIAGNOSIS — E785 Hyperlipidemia, unspecified: Secondary | ICD-10-CM

## 2018-11-20 ENCOUNTER — Ambulatory Visit
Admission: RE | Admit: 2018-11-20 | Discharge: 2018-11-20 | Disposition: A | Discharge status: Home / Self Care | Payer: Self-pay | Source: Ambulatory Visit | Attending: Endocrinology | Admitting: Endocrinology

## 2018-11-20 DIAGNOSIS — E785 Hyperlipidemia, unspecified: Secondary | ICD-10-CM

## 2018-12-17 DIAGNOSIS — R82998 Other abnormal findings in urine: Secondary | ICD-10-CM | POA: Diagnosis not present

## 2018-12-17 DIAGNOSIS — Z125 Encounter for screening for malignant neoplasm of prostate: Secondary | ICD-10-CM | POA: Diagnosis not present

## 2018-12-17 DIAGNOSIS — E7849 Other hyperlipidemia: Secondary | ICD-10-CM | POA: Diagnosis not present

## 2018-12-17 DIAGNOSIS — Z0000 Encounter for general adult medical examination without abnormal findings: Secondary | ICD-10-CM | POA: Diagnosis not present

## 2018-12-17 DIAGNOSIS — E1169 Type 2 diabetes mellitus with other specified complication: Secondary | ICD-10-CM | POA: Diagnosis not present

## 2018-12-17 DIAGNOSIS — E038 Other specified hypothyroidism: Secondary | ICD-10-CM | POA: Diagnosis not present

## 2018-12-24 DIAGNOSIS — E042 Nontoxic multinodular goiter: Secondary | ICD-10-CM | POA: Diagnosis not present

## 2018-12-24 DIAGNOSIS — R7309 Other abnormal glucose: Secondary | ICD-10-CM | POA: Diagnosis not present

## 2018-12-24 DIAGNOSIS — C61 Malignant neoplasm of prostate: Secondary | ICD-10-CM | POA: Diagnosis not present

## 2018-12-24 DIAGNOSIS — E039 Hypothyroidism, unspecified: Secondary | ICD-10-CM | POA: Diagnosis not present

## 2018-12-24 DIAGNOSIS — Z0000 Encounter for general adult medical examination without abnormal findings: Secondary | ICD-10-CM | POA: Diagnosis not present

## 2019-02-05 DIAGNOSIS — C61 Malignant neoplasm of prostate: Secondary | ICD-10-CM | POA: Diagnosis not present

## 2019-02-12 DIAGNOSIS — N486 Induration penis plastica: Secondary | ICD-10-CM | POA: Diagnosis not present

## 2019-02-12 DIAGNOSIS — N5201 Erectile dysfunction due to arterial insufficiency: Secondary | ICD-10-CM | POA: Diagnosis not present

## 2019-02-12 DIAGNOSIS — C61 Malignant neoplasm of prostate: Secondary | ICD-10-CM | POA: Diagnosis not present

## 2019-02-12 DIAGNOSIS — N202 Calculus of kidney with calculus of ureter: Secondary | ICD-10-CM | POA: Diagnosis not present

## 2019-04-19 DIAGNOSIS — R7309 Other abnormal glucose: Secondary | ICD-10-CM | POA: Diagnosis not present

## 2019-04-19 DIAGNOSIS — E7849 Other hyperlipidemia: Secondary | ICD-10-CM | POA: Diagnosis not present

## 2019-04-24 DIAGNOSIS — C61 Malignant neoplasm of prostate: Secondary | ICD-10-CM | POA: Diagnosis not present

## 2019-04-24 DIAGNOSIS — E785 Hyperlipidemia, unspecified: Secondary | ICD-10-CM | POA: Diagnosis not present

## 2019-04-24 DIAGNOSIS — R7309 Other abnormal glucose: Secondary | ICD-10-CM | POA: Diagnosis not present

## 2019-04-24 DIAGNOSIS — E039 Hypothyroidism, unspecified: Secondary | ICD-10-CM | POA: Diagnosis not present

## 2019-06-07 DIAGNOSIS — C61 Malignant neoplasm of prostate: Secondary | ICD-10-CM

## 2019-06-07 HISTORY — DX: Malignant neoplasm of prostate: C61

## 2019-07-22 DIAGNOSIS — E039 Hypothyroidism, unspecified: Secondary | ICD-10-CM | POA: Diagnosis not present

## 2019-07-22 DIAGNOSIS — E7849 Other hyperlipidemia: Secondary | ICD-10-CM | POA: Diagnosis not present

## 2019-08-01 DIAGNOSIS — E1169 Type 2 diabetes mellitus with other specified complication: Secondary | ICD-10-CM | POA: Diagnosis not present

## 2019-08-01 DIAGNOSIS — G4733 Obstructive sleep apnea (adult) (pediatric): Secondary | ICD-10-CM | POA: Diagnosis not present

## 2019-08-01 DIAGNOSIS — E039 Hypothyroidism, unspecified: Secondary | ICD-10-CM | POA: Diagnosis not present

## 2019-08-01 DIAGNOSIS — E785 Hyperlipidemia, unspecified: Secondary | ICD-10-CM | POA: Diagnosis not present

## 2019-08-01 DIAGNOSIS — R7309 Other abnormal glucose: Secondary | ICD-10-CM | POA: Diagnosis not present

## 2019-08-05 DIAGNOSIS — C61 Malignant neoplasm of prostate: Secondary | ICD-10-CM | POA: Diagnosis not present

## 2019-08-13 DIAGNOSIS — C61 Malignant neoplasm of prostate: Secondary | ICD-10-CM | POA: Diagnosis not present

## 2019-08-13 DIAGNOSIS — N5201 Erectile dysfunction due to arterial insufficiency: Secondary | ICD-10-CM | POA: Diagnosis not present

## 2019-08-13 DIAGNOSIS — R3911 Hesitancy of micturition: Secondary | ICD-10-CM | POA: Diagnosis not present

## 2019-08-19 DIAGNOSIS — R3911 Hesitancy of micturition: Secondary | ICD-10-CM | POA: Diagnosis not present

## 2019-08-19 DIAGNOSIS — C61 Malignant neoplasm of prostate: Secondary | ICD-10-CM | POA: Diagnosis not present

## 2019-08-19 IMAGING — CT CT HEART SCORING
3 series · 14 of 20 positions shown, 16 images · non-contrast
Comparison: None.

CLINICAL DATA: Hyperlipidemia.

EXAM:
CT HEART FOR CALCIUM SCORING
TECHNIQUE: CT heart was performed using prospective ECG gating.
A non-contrast exam for calcium scoring was performed.
Note that this exam targets the heart and the chest was not imaged
in its entirety.

[Series 2: calcium scoring 2.00 qr36 bestdiast 70% · axial · 0.44mm/px · z∈[+1620,+1740]mm · 4 of 100 slices shown]
[im 20/100  vessel]
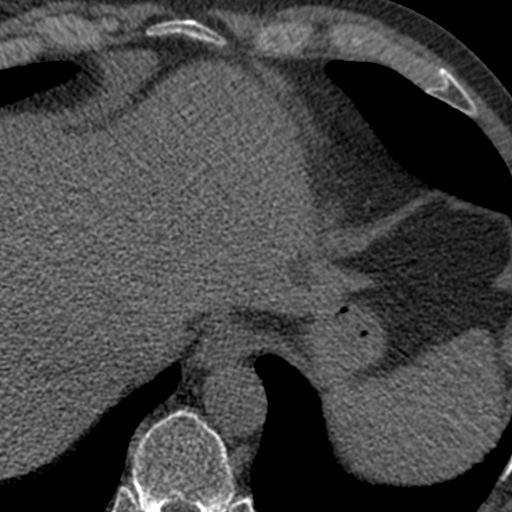
[im 40/100  vessel]
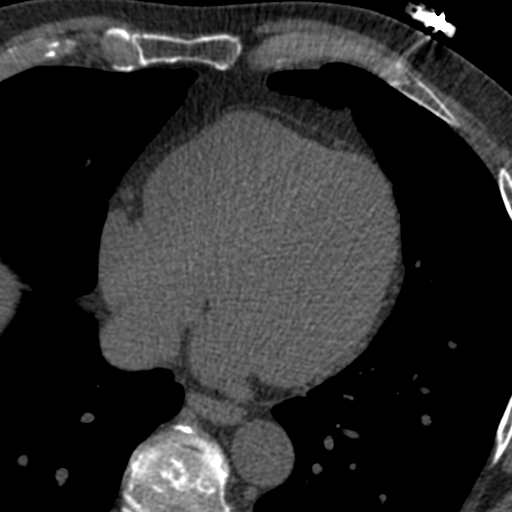
[im 60/100  vessel]
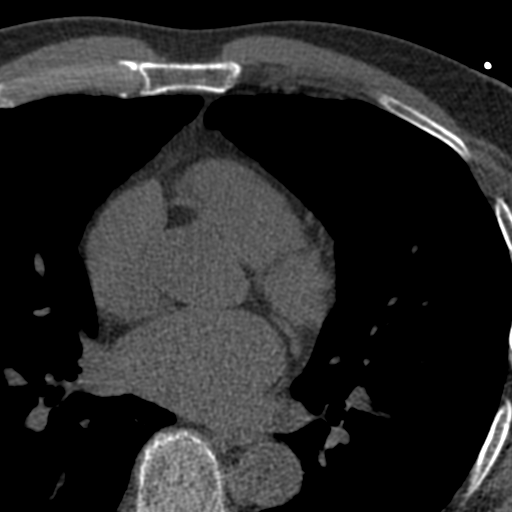
[im 80/100  vessel]
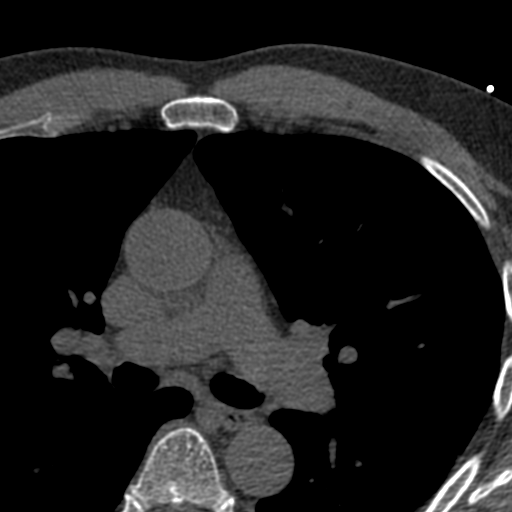

[Series 3: calcium scoring 2.00 br40 bestdiast 70% ax fov · axial · 0.66mm/px · z∈[+1614,+1746]mm · 5 of 100 slices shown, 7 images]
[im 17/100  vessel]
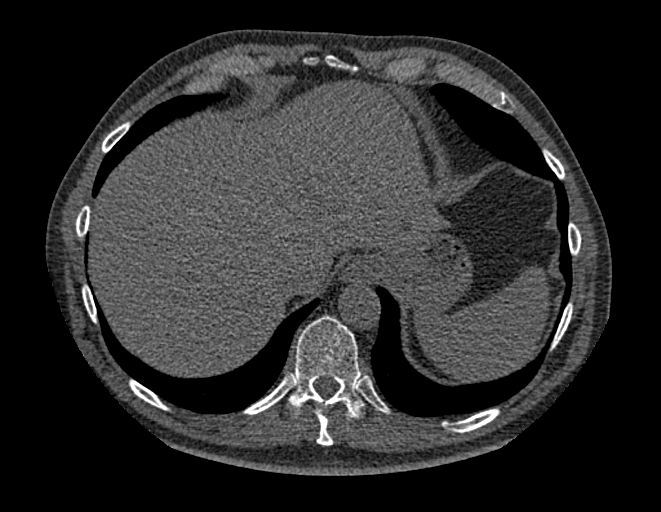
[im 17/100  lung]
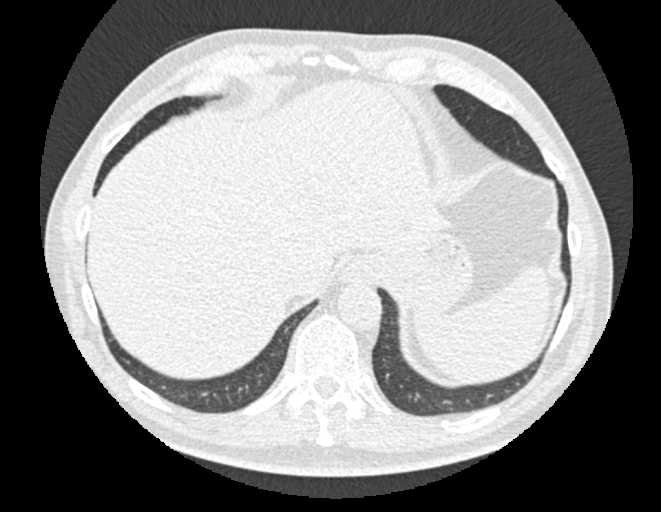
[im 34/100  vessel]
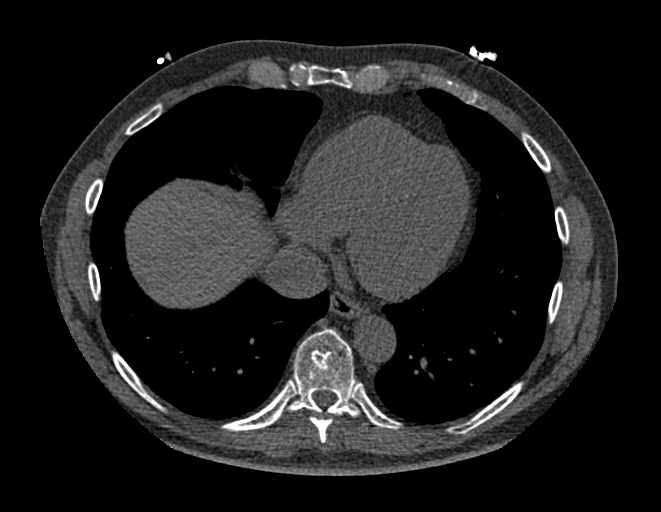
[im 50/100  vessel]
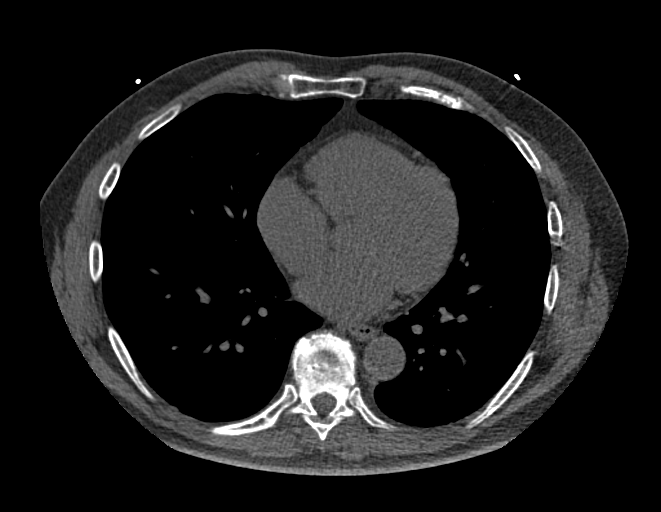
[im 67/100  vessel]
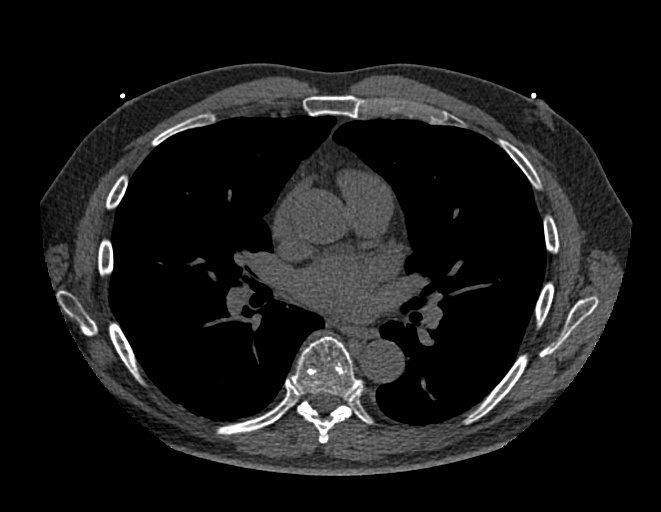
[im 83/100  vessel]
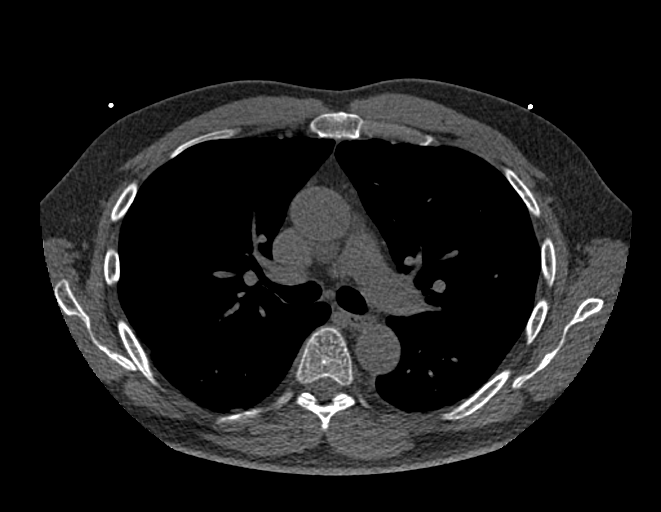
[im 83/100  lung]
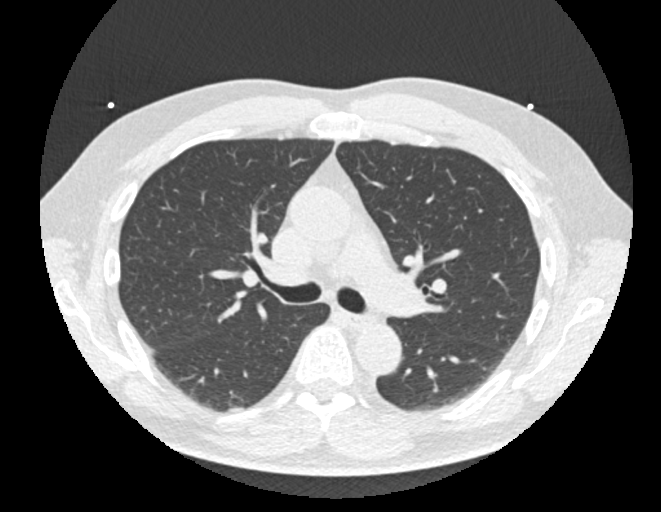

[Series 9: calcium scoring 2.00 br60 bestdiast 70% ax fov · axial · 0.64mm/px · z∈[+1614,+1746]mm · 5 of 100 slices shown]
[im 17/100  vessel]
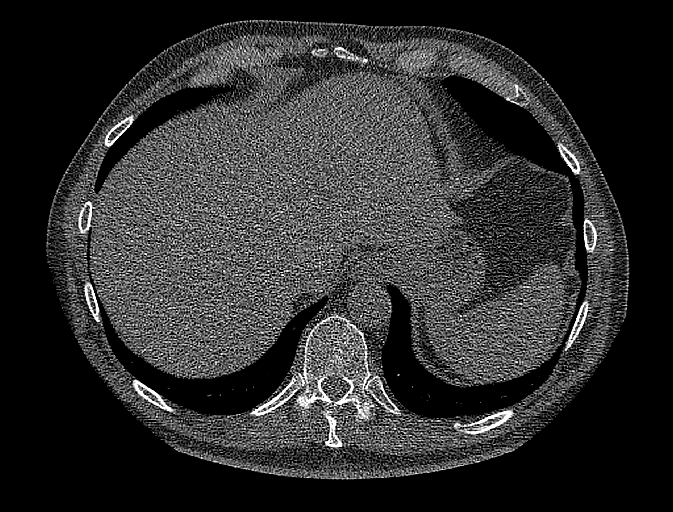
[im 34/100  vessel]
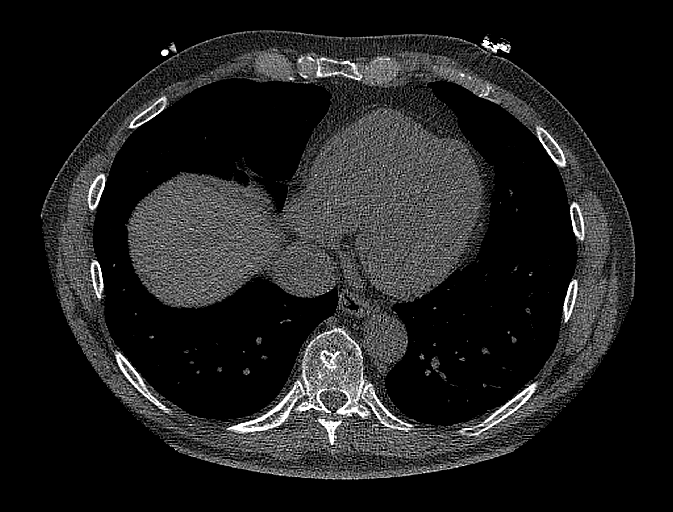
[im 50/100  vessel]
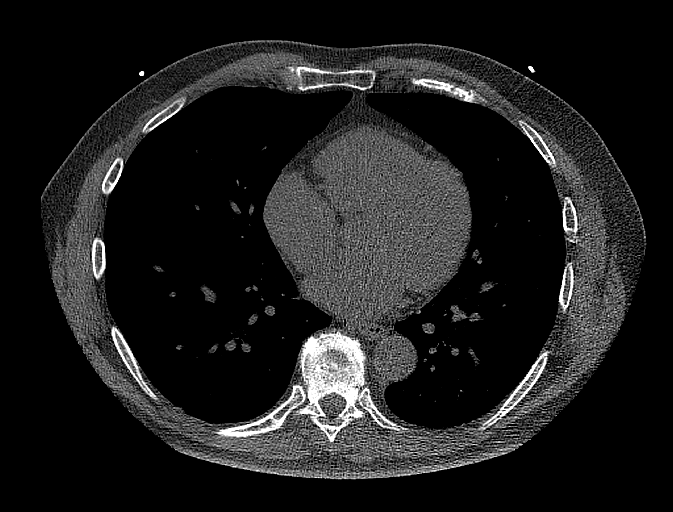
[im 67/100  vessel]
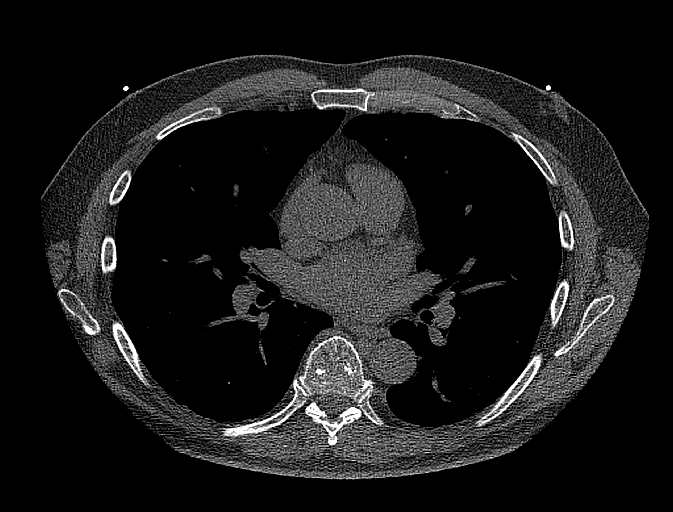
[im 83/100  vessel]
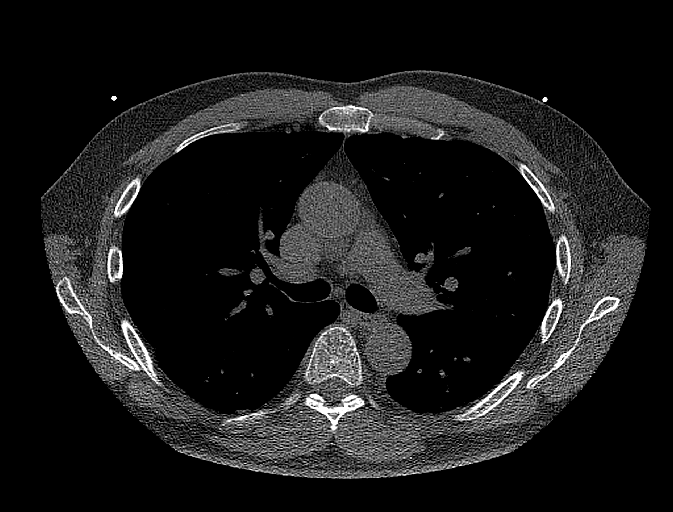

[14 of 20 positions shown; findings below may reference images not displayed]

FINDINGS: Technical quality: Good.

CORONARY CALCIUM

Total Agatston Score: 0-no coronary calcium identified.

OTHER FINDINGS:

Cardiovascular: Aortic atherosclerosis. Normal heart size, without
pericardial effusion.

Mediastinum/Nodes: No imaged thoracic adenopathy.

Lungs/Pleura: No imaged pleural fluid.  Clear imaged lungs.

Upper Abdomen: Low-density liver lesions are well-circumscribed and
likely cysts. The largest is in segment 4A. These are relatively
similar on 02/08/2018 abdominal CT. Normal imaged portions of the
spleen, stomach, right adrenal gland.

Musculoskeletal: Midthoracic spondylosis. Borderline to mild
vertebral body height loss of multiple midthoracic levels, favored
to be within normal variation.
IMPRESSION: 1. No coronary calcium identified.
2. No acute process in the imaged chest.

## 2019-09-02 DIAGNOSIS — C61 Malignant neoplasm of prostate: Secondary | ICD-10-CM | POA: Diagnosis not present

## 2019-10-08 ENCOUNTER — Other Ambulatory Visit: Payer: Self-pay

## 2019-10-08 ENCOUNTER — Ambulatory Visit (INDEPENDENT_AMBULATORY_CARE_PROVIDER_SITE_OTHER): Payer: BC Managed Care – PPO | Admitting: Dermatology

## 2019-10-08 ENCOUNTER — Encounter: Payer: Self-pay | Admitting: Dermatology

## 2019-10-08 DIAGNOSIS — L111 Transient acantholytic dermatosis [Grover]: Secondary | ICD-10-CM | POA: Diagnosis not present

## 2019-10-08 DIAGNOSIS — L821 Other seborrheic keratosis: Secondary | ICD-10-CM

## 2019-10-08 DIAGNOSIS — L570 Actinic keratosis: Secondary | ICD-10-CM

## 2019-10-08 DIAGNOSIS — D225 Melanocytic nevi of trunk: Secondary | ICD-10-CM | POA: Diagnosis not present

## 2019-10-08 DIAGNOSIS — D229 Melanocytic nevi, unspecified: Secondary | ICD-10-CM

## 2019-10-08 DIAGNOSIS — Z1283 Encounter for screening for malignant neoplasm of skin: Secondary | ICD-10-CM | POA: Diagnosis not present

## 2019-10-08 NOTE — Patient Instructions (Addendum)
General skin examination for Kenneth Shaw for the first time in many years.  His wife is concerned with several spots.  Examination showed no atypical moles or melanoma.  The darkest lesion on the right outer back is a benign keratosis and does not require watching or removal.  Additionally Kenneth Shaw has several dozen keratoses on his shoulders and arms and legs.  Most of these are clinically benign seborrheic or punctate keratoses.  1 thicker solar keratosis below the right mid jawline is a precancer and treated with 5-second liquid nitrogen spray an area on the upper right forehead which had grown and bled 1 time is now completely cleared to see or feel; should this recur I will take a second look.  Kenneth Shaw is a candidate to use some topical fluorouracil (Tolak) in the late fall or winter this can be done either by a visit or by a phone conversation.  Additionally he has several dozen pink 4 mm swollen bumps on the lower chest abdomen which he thought were sweat bumps but which fit a diagnosis of Grovers disease.  This is a benign condition which is not contagious and is not due to allergy or infection.  2 out of 3 cases of Grovers disease will eventually disappear with no treatment.  Mr. Kenneth Shaw knows he could call me at any time if the forehead lesion recurs to have it rechecked.

## 2019-10-08 NOTE — Progress Notes (Addendum)
   New Patient   Subjective  Kenneth Shaw is a 64 y.o. male who presents for the following: New Patient (Initial Visit) (Here for general look over. Per wife, has a couple places across his back that need to be checked. He did have a place on his right mid forehead that was worrisome for about 3 months. He states it was a sore, crater like place that seemed non healing. Finally, disappeared. ).  Growths Location: Forehead and back Duration: Several months Quality: Forehead lesion cleared Associated Signs/Symptoms: Modifying Factors: Wife checks back Severity:  Timing: Context: History of nonmelanoma skin cancer2   The following portions of the chart were reviewed this encounter and updated as appropriate: Tobacco  Allergies  Meds  Problems  Med Hx  Surg Hx  Fam Hx      Objective  Well appearing patient in no apparent distress; mood and affect are within normal limits.  All skin waist up examined.   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); Right Breast  Currently no symptoms so no intervention  AK (actinic keratosis) Right Anterior Mandible  Destruction of lesion - Right Anterior Mandible Complexity: simple   Destruction method: cryotherapy   Informed consent: discussed and consent obtained   Timeout:  patient name, date of birth, surgical site, and procedure verified Lesion destroyed using liquid nitrogen: Yes   Region frozen until ice ball extended beyond lesion: Yes   Cryotherapy cycles:  5 Outcome: patient tolerated procedure well with no complications   Additional details:  Return for biopsy or freezing fails General skin examination for Kenneth Shaw for the first time in many years.  His wife is concerned with several spots.  Examination showed no atypical moles or melanoma.  The darkest lesion on the right outer back is a benign keratosis and  does not require watching or removal.  Additionally Kenneth Shaw has several dozen keratoses on his shoulders and arms and legs.  Most of these are clinically benign seborrheic or punctate keratoses.  1 thicker solar keratosis below the right mid jawline is a precancer and treated with 5-second liquid nitrogen spray an area on the upper right forehead which had grown and bled 1 time is now completely cleared to see or feel; should this recur I will take a second look.  Kenneth Shaw is a candidate to use some topical fluorouracil (Tolak) in the late fall or winter this can be done either by a visit or by a phone conversation.  Additionally he has several dozen pink 4 mm swollen bumps on the lower chest abdomen which he thought were sweat bumps but which fit a diagnosis of Grovers disease.  This is a benign condition which is not contagious and is not due to allergy or infection.  2 out of 3 cases of Grovers disease will eventually disappear with no treatment.  Kenneth Shaw knows he could call me at any time if the forehead lesion recurs to have it rechecked.

## 2019-10-10 ENCOUNTER — Other Ambulatory Visit: Payer: Self-pay | Admitting: Urology

## 2019-10-23 ENCOUNTER — Encounter: Payer: Self-pay | Admitting: Dermatology

## 2019-10-24 DIAGNOSIS — N3943 Post-void dribbling: Secondary | ICD-10-CM | POA: Diagnosis not present

## 2019-10-24 DIAGNOSIS — M62838 Other muscle spasm: Secondary | ICD-10-CM | POA: Diagnosis not present

## 2019-10-24 DIAGNOSIS — R39191 Need to immediately re-void: Secondary | ICD-10-CM | POA: Diagnosis not present

## 2019-10-24 DIAGNOSIS — R350 Frequency of micturition: Secondary | ICD-10-CM | POA: Diagnosis not present

## 2019-11-12 DIAGNOSIS — E7849 Other hyperlipidemia: Secondary | ICD-10-CM | POA: Diagnosis not present

## 2019-11-12 DIAGNOSIS — C61 Malignant neoplasm of prostate: Secondary | ICD-10-CM | POA: Diagnosis not present

## 2019-11-12 DIAGNOSIS — N486 Induration penis plastica: Secondary | ICD-10-CM | POA: Diagnosis not present

## 2019-11-12 DIAGNOSIS — R350 Frequency of micturition: Secondary | ICD-10-CM | POA: Diagnosis not present

## 2019-11-12 DIAGNOSIS — E1169 Type 2 diabetes mellitus with other specified complication: Secondary | ICD-10-CM | POA: Diagnosis not present

## 2019-11-12 DIAGNOSIS — N202 Calculus of kidney with calculus of ureter: Secondary | ICD-10-CM | POA: Diagnosis not present

## 2019-11-13 DIAGNOSIS — R350 Frequency of micturition: Secondary | ICD-10-CM | POA: Diagnosis not present

## 2019-11-13 DIAGNOSIS — M62838 Other muscle spasm: Secondary | ICD-10-CM | POA: Diagnosis not present

## 2019-11-13 DIAGNOSIS — N3941 Urge incontinence: Secondary | ICD-10-CM | POA: Diagnosis not present

## 2019-11-19 ENCOUNTER — Encounter (HOSPITAL_COMMUNITY): Payer: Self-pay

## 2019-11-19 NOTE — Progress Notes (Addendum)
COVID Vaccine Completed: No Date COVID Vaccine completed: N/A COVID vaccine manufacturer: N/A   PCP - Dr. Baldwin Crown last office visit 11/20/2019 Cardiologist - N/A  Chest x-ray - N/A EKG - 11/20/2019 in epic Stress Test - N/A ECHO - N/A Cardiac Cath - N/A  Sleep Study - Yes BIPAP - Yes  Fasting Blood Sugar - N/A Checks Blood Sugar ___N/A__ times a day Hgba1c 5.7   Blood Thinner Instructions: N/A Aspirin Instructions: Yes Last Dose: will stop 5-7 days prior to procedure  Anesthesia review: N/A  Patient denies shortness of breath, fever, cough and chest pain at PAT appointment   Patient verbalized understanding of instructions that were given to them at the PAT appointment. Patient was also instructed that they will need to review over the PAT instructions again at home before surgery.

## 2019-11-19 NOTE — Patient Instructions (Addendum)
DUE TO COVID-19 ONLY ONE VISITOR ARE ALLOWED TO COME WITH YOU AND STAY IN THE WAITING ROOM ONLY DURING PRE OP AND PROCEDURE. THEN TWO VISITORS MAY VISIT WITH YOU IN YOUR PRIVATE ROOM DURING VISITING HOURS ONLY!!   COVID SWAB TESTING MUST BE COMPLETED ON: Tuesday, November 26, 2019 at  3:10PM   [address], [place] [facility] (Must self quarantine after testing. Follow instructions on handout.)             Your procedure is scheduled on: Friday, November 29, 2019   Report to [facility] Main  Entrance   Report to Short Stay at 5:30 AM   [facility])   Call this number if you have problems the morning of surgery [phone]   The day before surgery liquid diet only   CLEAR LIQUID DIET  Foods Allowed                                                                     Foods Excluded  Water, Black Coffee and tea, regular and decaf                             liquids that you cannot  Plain Jell-O in any flavor  (No red)                                           see through such as: Fruit ices (not with fruit pulp)                                     milk, soups, orange juice  Iced Popsicles (No red)                                    All solid food                                   Apple juices Sports drinks like Gatorade (No red) Lightly seasoned clear broth or consume(fat free) Sugar, honey syrup  Sample Menu Breakfast                                Lunch                                     Supper Cranberry juice                    Beef broth                            Chicken broth Jell-O  Grape juice                           Apple juice Coffee or tea                        Jell-O                                      Popsicle                                                Coffee or tea                        Coffee or tea    Magnesium Citrate:  Drink by noon the day before  surgery.   Do not eat food or drink liquids :After Midnight.   Oral Hygiene is also important to reduce your risk of infection.                                    Remember - BRUSH YOUR TEETH THE MORNING OF SURGERY WITH YOUR REGULAR TOOTHPASTE   Do NOT smoke after Midnight   Take these medicines the morning of surgery with A SIP OF WATER: Zetia, Euthyrox  DO NOT TAKE ANY ORAL DIABETIC MEDICATIONS DAY OF YOUR SURGERY                               You may not have any metal on your body including  jewelry, and body piercings             Do not wear  lotions, powders, perfumes/cologne, or deodorant             Do not wear nail polish.  Do not shave  48 hours prior to surgery.              Men may shave face and neck.   Do not bring valuables to the hospital. [place].   Contacts, dentures or bridgework may not be worn into surgery.   Bring small overnight bag day of surgery.    Patients discharged the day of surgery will not be allowed to drive home.   Special Instructions: Bring a copy of your healthcare power of attorney and living will documents         the day of surgery if you haven't scanned them in before.              Please read over the following fact sheets you were given: IF YOU HAVE QUESTIONS ABOUT YOUR PRE OP INSTRUCTIONS PLEASE CALL [phone]   [place] - Preparing for Surgery Before surgery, you can play an important role.  Because skin is not sterile, your skin needs to be as free of germs as possible.  You can reduce the number of germs on your skin by washing with CHG (chlorahexidine gluconate) soap before surgery.  CHG is an antiseptic  cleaner which kills germs and bonds with the skin to continue killing germs even after washing. Please DO NOT use if you have an allergy to CHG or antibacterial soaps.  If your skin becomes reddened/irritated stop using the CHG and inform your nurse when you arrive at Short  Stay. Do not shave (including legs and underarms) for at least 48 hours prior to the first CHG shower.  You may shave your face/neck.  Please follow these instructions carefully:  1.  Shower with CHG Soap the night before surgery and the  morning of surgery.  2.  If you choose to wash your hair, wash your hair first as usual with your normal  shampoo.  3.  After you shampoo, rinse your hair and body thoroughly to remove the shampoo.                             4.  Use CHG as you would any other liquid soap.  You can apply chg directly to the skin and wash.  Gently with a scrungie or clean washcloth.  5.  Apply the CHG Soap to your body ONLY FROM THE NECK DOWN.   Do   not use on face/ open                           Wound or open sores. Avoid contact with eyes, ears mouth and   genitals (private parts).                       Wash face,  Genitals (private parts) with your normal soap.             6.  Wash thoroughly, paying special attention to the area where your    surgery  will be performed.  7.  Thoroughly rinse your body with warm water from the neck down.  8.  DO NOT shower/wash with your normal soap after using and rinsing off the CHG Soap.                9.  Pat yourself dry with a clean towel.            10.  Wear clean pajamas.            11.  Place clean sheets on your bed the night of your first shower and do not  sleep with pets. Day of Surgery : Do not apply any lotions/deodorants the morning of surgery.  Please wear clean clothes to the hospital/surgery center.  FAILURE TO FOLLOW THESE INSTRUCTIONS MAY RESULT IN THE CANCELLATION OF YOUR SURGERY  PATIENT SIGNATURE_________________________________  NURSE SIGNATURE__________________________________  ________________________________________________________________________

## 2019-11-20 ENCOUNTER — Encounter (HOSPITAL_COMMUNITY)
Admission: RE | Admit: 2019-11-20 | Discharge: 2019-11-20 | Disposition: A | Discharge status: Home / Self Care | Payer: BC Managed Care – PPO | Source: Ambulatory Visit | Attending: Urology | Admitting: Urology

## 2019-11-20 ENCOUNTER — Other Ambulatory Visit: Payer: Self-pay

## 2019-11-20 ENCOUNTER — Encounter (HOSPITAL_COMMUNITY): Payer: Self-pay

## 2019-11-20 DIAGNOSIS — R9431 Abnormal electrocardiogram [ECG] [EKG]: Secondary | ICD-10-CM | POA: Insufficient documentation

## 2019-11-20 DIAGNOSIS — Z01818 Encounter for other preprocedural examination: Secondary | ICD-10-CM | POA: Diagnosis not present

## 2019-11-20 DIAGNOSIS — N200 Calculus of kidney: Secondary | ICD-10-CM | POA: Diagnosis not present

## 2019-11-20 DIAGNOSIS — I700 Atherosclerosis of aorta: Secondary | ICD-10-CM | POA: Diagnosis not present

## 2019-11-20 DIAGNOSIS — C61 Malignant neoplasm of prostate: Secondary | ICD-10-CM | POA: Diagnosis not present

## 2019-11-20 DIAGNOSIS — G4733 Obstructive sleep apnea (adult) (pediatric): Secondary | ICD-10-CM | POA: Diagnosis not present

## 2019-11-20 HISTORY — DX: Malignant neoplasm of prostate: C61

## 2019-11-20 HISTORY — DX: Prediabetes: R73.03

## 2019-11-20 HISTORY — DX: Personal history of urinary calculi: Z87.442

## 2019-11-20 HISTORY — DX: Hypothyroidism, unspecified: E03.9

## 2019-11-20 HISTORY — DX: Palmar fascial fibromatosis (dupuytren): M72.0

## 2019-11-20 LAB — BASIC METABOLIC PANEL
Anion gap: 10 (ref 5–15)
BUN: 22 mg/dL (ref 8–23)
CO2: 24 mmol/L (ref 22–32)
Calcium: 9.1 mg/dL (ref 8.9–10.3)
Chloride: 106 mmol/L (ref 98–111)
Creatinine, Ser: 0.95 mg/dL (ref 0.61–1.24)
GFR calc Af Amer: >60 mL/min (ref >60)
GFR calc non Af Amer: >60 mL/min (ref >60)
Glucose, Bld: 118 mg/dL — ABNORMAL HIGH (ref 70–99)
Potassium: 4.1 mmol/L (ref 3.5–5.1)
Sodium: 140 mmol/L (ref 135–145)

## 2019-11-20 LAB — CBC
HCT: 44.1 % (ref 39.0–52.0)
Hemoglobin: 15.1 g/dL (ref 13.0–17.0)
MCH: 32.1 pg (ref 26.0–34.0)
MCHC: 34.2 g/dL (ref 30.0–36.0)
MCV: 93.8 fL (ref 80.0–100.0)
Platelets: 203 10*3/uL (ref 150–400)
RBC: 4.7 MIL/uL (ref 4.22–5.81)
RDW: 13.3 % (ref 11.5–15.5)
WBC: 5.8 10*3/uL (ref 4.0–10.5)
nRBC: 0 % (ref 0.0–0.2)

## 2019-11-20 LAB — GLUCOSE, CAPILLARY: Glucose-Capillary: 114 mg/dL — ABNORMAL HIGH (ref 70–99)

## 2019-11-26 ENCOUNTER — Other Ambulatory Visit (HOSPITAL_COMMUNITY)
Admission: RE | Admit: 2019-11-26 | Discharge: 2019-11-26 | Disposition: A | Discharge status: Home / Self Care | Payer: BC Managed Care – PPO | Source: Ambulatory Visit | Attending: Urology | Admitting: Urology

## 2019-11-26 DIAGNOSIS — Z01812 Encounter for preprocedural laboratory examination: Secondary | ICD-10-CM | POA: Diagnosis not present

## 2019-11-26 DIAGNOSIS — Z20822 Contact with and (suspected) exposure to covid-19: Secondary | ICD-10-CM | POA: Diagnosis not present

## 2019-11-26 LAB — SARS CORONAVIRUS 2 (TAT 6-24 HRS): SARS Coronavirus 2: NEGATIVE

## 2019-11-29 ENCOUNTER — Observation Stay (HOSPITAL_COMMUNITY)
Admission: RE | Admit: 2019-11-29 | Discharge: 2019-11-30 | Disposition: A | Discharge status: Home / Self Care | Payer: BC Managed Care – PPO | Source: Other Acute Inpatient Hospital | Attending: Urology | Admitting: Urology

## 2019-11-29 ENCOUNTER — Encounter (HOSPITAL_COMMUNITY)
Admission: RE | Disposition: A | Discharge status: Home / Self Care | Payer: Self-pay | Source: Other Acute Inpatient Hospital | Attending: Urology

## 2019-11-29 ENCOUNTER — Ambulatory Visit (HOSPITAL_COMMUNITY): Payer: BC Managed Care – PPO | Admitting: Anesthesiology

## 2019-11-29 ENCOUNTER — Other Ambulatory Visit: Payer: Self-pay

## 2019-11-29 ENCOUNTER — Encounter (HOSPITAL_COMMUNITY): Payer: Self-pay | Admitting: Urology

## 2019-11-29 DIAGNOSIS — N401 Enlarged prostate with lower urinary tract symptoms: Principal | ICD-10-CM | POA: Insufficient documentation

## 2019-11-29 DIAGNOSIS — Z79890 Hormone replacement therapy (postmenopausal): Secondary | ICD-10-CM | POA: Diagnosis not present

## 2019-11-29 DIAGNOSIS — Z79899 Other long term (current) drug therapy: Secondary | ICD-10-CM | POA: Insufficient documentation

## 2019-11-29 DIAGNOSIS — C61 Malignant neoplasm of prostate: Secondary | ICD-10-CM | POA: Diagnosis not present

## 2019-11-29 DIAGNOSIS — G4730 Sleep apnea, unspecified: Secondary | ICD-10-CM | POA: Insufficient documentation

## 2019-11-29 DIAGNOSIS — Z7982 Long term (current) use of aspirin: Secondary | ICD-10-CM | POA: Diagnosis not present

## 2019-11-29 DIAGNOSIS — Z8349 Family history of other endocrine, nutritional and metabolic diseases: Secondary | ICD-10-CM | POA: Diagnosis not present

## 2019-11-29 DIAGNOSIS — N400 Enlarged prostate without lower urinary tract symptoms: Secondary | ICD-10-CM | POA: Diagnosis not present

## 2019-11-29 DIAGNOSIS — Z96652 Presence of left artificial knee joint: Secondary | ICD-10-CM | POA: Insufficient documentation

## 2019-11-29 DIAGNOSIS — Z87891 Personal history of nicotine dependence: Secondary | ICD-10-CM | POA: Diagnosis not present

## 2019-11-29 DIAGNOSIS — G4733 Obstructive sleep apnea (adult) (pediatric): Secondary | ICD-10-CM | POA: Diagnosis not present

## 2019-11-29 DIAGNOSIS — E785 Hyperlipidemia, unspecified: Secondary | ICD-10-CM | POA: Insufficient documentation

## 2019-11-29 DIAGNOSIS — N138 Other obstructive and reflux uropathy: Secondary | ICD-10-CM | POA: Diagnosis not present

## 2019-11-29 DIAGNOSIS — E039 Hypothyroidism, unspecified: Secondary | ICD-10-CM | POA: Diagnosis not present

## 2019-11-29 DIAGNOSIS — M1990 Unspecified osteoarthritis, unspecified site: Secondary | ICD-10-CM | POA: Diagnosis not present

## 2019-11-29 DIAGNOSIS — Z85828 Personal history of other malignant neoplasm of skin: Secondary | ICD-10-CM | POA: Diagnosis not present

## 2019-11-29 HISTORY — PX: LYMPHADENECTOMY: SHX5960

## 2019-11-29 HISTORY — PX: ROBOT ASSISTED LAPAROSCOPIC RADICAL PROSTATECTOMY: SHX5141

## 2019-11-29 LAB — TYPE AND SCREEN
ABO/RH(D): O NEG
Antibody Screen: NEGATIVE

## 2019-11-29 LAB — GLUCOSE, CAPILLARY: Glucose-Capillary: 119 mg/dL — ABNORMAL HIGH (ref 70–99)

## 2019-11-29 LAB — HEMOGLOBIN AND HEMATOCRIT, BLOOD
HCT: 41.3 % (ref 39.0–52.0)
Hemoglobin: 14 g/dL (ref 13.0–17.0)

## 2019-11-29 SURGERY — PROSTATECTOMY, RADICAL, ROBOT-ASSISTED, LAPAROSCOPIC
Anesthesia: General

## 2019-11-29 MED ORDER — DEXAMETHASONE SODIUM PHOSPHATE 10 MG/ML IJ SOLN
INTRAMUSCULAR | Status: AC
  Filled 2019-11-29: qty 1

## 2019-11-29 MED ORDER — DIPHENHYDRAMINE HCL 50 MG/ML IJ SOLN: 12.5000 mg | Freq: Four times a day (QID) | INTRAMUSCULAR | Status: DC | PRN

## 2019-11-29 MED ORDER — CEFAZOLIN SODIUM-DEXTROSE 2-4 GM/100ML-% IV SOLN
2.0000 g | INTRAVENOUS | Status: AC
  Administered 2019-11-29: 2 g via INTRAVENOUS
  Filled 2019-11-29: qty 100

## 2019-11-29 MED ORDER — MEPERIDINE HCL 50 MG/ML IJ SOLN
INTRAMUSCULAR | Status: AC
  Filled 2019-11-29: qty 1

## 2019-11-29 MED ORDER — ORAL CARE MOUTH RINSE: 15.0000 mL | Freq: Once | OROMUCOSAL | Status: AC

## 2019-11-29 MED ORDER — ROCURONIUM BROMIDE 10 MG/ML (PF) SYRINGE
PREFILLED_SYRINGE | INTRAVENOUS | Status: AC
  Filled 2019-11-29: qty 10

## 2019-11-29 MED ORDER — SODIUM CHLORIDE (PF) 0.9 % IJ SOLN
INTRAMUSCULAR | Status: DC | PRN
  Administered 2019-11-29: 20 mL

## 2019-11-29 MED ORDER — ACETAMINOPHEN 325 MG PO TABS: 650.0000 mg | ORAL_TABLET | ORAL | Status: DC | PRN

## 2019-11-29 MED ORDER — LIDOCAINE 2% (20 MG/ML) 5 ML SYRINGE
INTRAMUSCULAR | Status: DC | PRN
  Administered 2019-11-29: 80 mg via INTRAVENOUS

## 2019-11-29 MED ORDER — MEPERIDINE HCL 50 MG/ML IJ SOLN
6.2500 mg | INTRAMUSCULAR | Status: DC | PRN
  Administered 2019-11-29: 12.5 mg via INTRAVENOUS

## 2019-11-29 MED ORDER — SENNOSIDES-DOCUSATE SODIUM 8.6-50 MG PO TABS
1.0000 | ORAL_TABLET | Freq: Two times a day (BID) | ORAL | 0 refills | Status: DC
Start: 2019-11-29 — End: 2021-01-26

## 2019-11-29 MED ORDER — MIDAZOLAM HCL 2 MG/2ML IJ SOLN
INTRAMUSCULAR | Status: AC
  Filled 2019-11-29: qty 2

## 2019-11-29 MED ORDER — SUGAMMADEX SODIUM 200 MG/2ML IV SOLN
INTRAVENOUS | Status: DC | PRN
  Administered 2019-11-29: 200 mg via INTRAVENOUS

## 2019-11-29 MED ORDER — ROCURONIUM BROMIDE 10 MG/ML (PF) SYRINGE
PREFILLED_SYRINGE | INTRAVENOUS | Status: DC | PRN
  Administered 2019-11-29: 45 mg via INTRAVENOUS
  Administered 2019-11-29: 35 mg via INTRAVENOUS
  Administered 2019-11-29: 10 mg via INTRAVENOUS
  Administered 2019-11-29: 20 mg via INTRAVENOUS

## 2019-11-29 MED ORDER — ACETAMINOPHEN 500 MG PO TABS
1000.0000 mg | ORAL_TABLET | Freq: Three times a day (TID) | ORAL | Status: AC
  Administered 2019-11-29 – 2019-11-30 (×3): 1000 mg via ORAL
  Filled 2019-11-29 (×3): qty 2

## 2019-11-29 MED ORDER — MORPHINE SULFATE (PF) 2 MG/ML IV SOLN: 2.0000 mg | INTRAVENOUS | Status: DC | PRN

## 2019-11-29 MED ORDER — HYDROMORPHONE HCL 1 MG/ML IJ SOLN
0.2500 mg | INTRAMUSCULAR | Status: DC | PRN
  Administered 2019-11-29: 0.5 mg via INTRAVENOUS

## 2019-11-29 MED ORDER — PROPOFOL 10 MG/ML IV BOLUS
INTRAVENOUS | Status: DC | PRN
  Administered 2019-11-29: 200 mg via INTRAVENOUS

## 2019-11-29 MED ORDER — CEFAZOLIN SODIUM-DEXTROSE 1-4 GM/50ML-% IV SOLN
1.0000 g | Freq: Three times a day (TID) | INTRAVENOUS | Status: AC
  Administered 2019-11-29 (×2): 1 g via INTRAVENOUS
  Filled 2019-11-29 (×2): qty 50

## 2019-11-29 MED ORDER — CHLORHEXIDINE GLUCONATE CLOTH 2 % EX PADS
6.0000 | MEDICATED_PAD | Freq: Every day | CUTANEOUS | Status: DC
  Administered 2019-11-29: 6 via TOPICAL

## 2019-11-29 MED ORDER — BACITRACIN-NEOMYCIN-POLYMYXIN 400-5-5000 EX OINT: 1.0000 "application " | TOPICAL_OINTMENT | Freq: Three times a day (TID) | CUTANEOUS | Status: DC | PRN

## 2019-11-29 MED ORDER — MAGNESIUM CITRATE PO SOLN: 1.0000 | Freq: Once | ORAL | Status: DC

## 2019-11-29 MED ORDER — ONDANSETRON HCL 4 MG/2ML IJ SOLN: 4.0000 mg | INTRAMUSCULAR | Status: DC | PRN

## 2019-11-29 MED ORDER — DIPHENHYDRAMINE HCL 12.5 MG/5ML PO ELIX: 12.5000 mg | ORAL_SOLUTION | Freq: Four times a day (QID) | ORAL | Status: DC | PRN

## 2019-11-29 MED ORDER — CHLORHEXIDINE GLUCONATE 0.12 % MT SOLN
15.0000 mL | Freq: Once | OROMUCOSAL | Status: AC
  Administered 2019-11-29: 15 mL via OROMUCOSAL

## 2019-11-29 MED ORDER — DEXAMETHASONE SODIUM PHOSPHATE 10 MG/ML IJ SOLN
INTRAMUSCULAR | Status: DC | PRN
  Administered 2019-11-29: 8 mg via INTRAVENOUS

## 2019-11-29 MED ORDER — ONDANSETRON HCL 4 MG/2ML IJ SOLN
INTRAMUSCULAR | Status: AC
  Filled 2019-11-29: qty 2

## 2019-11-29 MED ORDER — DOCUSATE SODIUM 100 MG PO CAPS
100.0000 mg | ORAL_CAPSULE | Freq: Two times a day (BID) | ORAL | Status: DC
  Administered 2019-11-30: 100 mg via ORAL
  Filled 2019-11-29 (×2): qty 1

## 2019-11-29 MED ORDER — SODIUM CHLORIDE (PF) 0.9 % IJ SOLN
INTRAMUSCULAR | Status: AC
  Filled 2019-11-29: qty 20

## 2019-11-29 MED ORDER — FENTANYL CITRATE (PF) 250 MCG/5ML IJ SOLN
INTRAMUSCULAR | Status: AC
  Filled 2019-11-29: qty 5

## 2019-11-29 MED ORDER — MIDAZOLAM HCL 5 MG/5ML IJ SOLN
INTRAMUSCULAR | Status: DC | PRN
  Administered 2019-11-29: 2 mg via INTRAVENOUS

## 2019-11-29 MED ORDER — HYDROCODONE-ACETAMINOPHEN 5-325 MG PO TABS: 1.0000 | ORAL_TABLET | Freq: Four times a day (QID) | ORAL | 0 refills | Status: DC | PRN

## 2019-11-29 MED ORDER — BUPIVACAINE LIPOSOME 1.3 % IJ SUSP
20.0000 mL | Freq: Once | INTRAMUSCULAR | Status: AC
  Administered 2019-11-29: 20 mL
  Filled 2019-11-29: qty 20

## 2019-11-29 MED ORDER — OXYCODONE HCL 5 MG PO TABS
5.0000 mg | ORAL_TABLET | ORAL | Status: DC | PRN
  Administered 2019-11-29: 5 mg via ORAL
  Filled 2019-11-29: qty 1

## 2019-11-29 MED ORDER — LACTATED RINGERS IR SOLN
Status: DC | PRN
  Administered 2019-11-29: 1000 mL

## 2019-11-29 MED ORDER — HYDROMORPHONE HCL 1 MG/ML IJ SOLN
INTRAMUSCULAR | Status: AC
  Administered 2019-11-29: 0.5 mg via INTRAVENOUS
  Filled 2019-11-29: qty 1

## 2019-11-29 MED ORDER — PROPOFOL 10 MG/ML IV BOLUS
INTRAVENOUS | Status: AC
  Filled 2019-11-29: qty 20

## 2019-11-29 MED ORDER — FENTANYL CITRATE (PF) 100 MCG/2ML IJ SOLN
INTRAMUSCULAR | Status: AC
  Filled 2019-11-29: qty 2

## 2019-11-29 MED ORDER — KCL IN DEXTROSE-NACL 20-5-0.45 MEQ/L-%-% IV SOLN
INTRAVENOUS | Status: DC
  Filled 2019-11-29 (×4): qty 1000

## 2019-11-29 MED ORDER — FENTANYL CITRATE (PF) 100 MCG/2ML IJ SOLN
INTRAMUSCULAR | Status: DC | PRN
  Administered 2019-11-29 (×3): 50 ug via INTRAVENOUS
  Administered 2019-11-29: 25 ug via INTRAVENOUS
  Administered 2019-11-29 (×2): 50 ug via INTRAVENOUS
  Administered 2019-11-29: 25 ug via INTRAVENOUS
  Administered 2019-11-29: 50 ug via INTRAVENOUS

## 2019-11-29 MED ORDER — LIDOCAINE 2% (20 MG/ML) 5 ML SYRINGE
INTRAMUSCULAR | Status: AC
  Filled 2019-11-29: qty 5

## 2019-11-29 MED ORDER — PROMETHAZINE HCL 25 MG/ML IJ SOLN: 6.2500 mg | INTRAMUSCULAR | Status: DC | PRN

## 2019-11-29 MED ORDER — ONDANSETRON HCL 4 MG/2ML IJ SOLN
INTRAMUSCULAR | Status: DC | PRN
  Administered 2019-11-29: 4 mg via INTRAVENOUS

## 2019-11-29 MED ORDER — SULFAMETHOXAZOLE-TRIMETHOPRIM 800-160 MG PO TABS
1.0000 | ORAL_TABLET | Freq: Two times a day (BID) | ORAL | 0 refills | Status: DC
Start: 2019-11-29 — End: 2021-01-26

## 2019-11-29 MED ORDER — LACTATED RINGERS IV SOLN: INTRAVENOUS | Status: DC

## 2019-11-29 SURGICAL SUPPLY — 65 items
APPLICATOR COTTON TIP 6 STRL (MISCELLANEOUS) ×2 IMPLANT
APPLICATOR COTTON TIP 6IN STRL (MISCELLANEOUS) ×3
CATH FOLEY 2WAY SLVR 18FR 30CC (CATHETERS) ×3 IMPLANT
CATH TIEMANN FOLEY 18FR 5CC (CATHETERS) ×3 IMPLANT
CHLORAPREP W/TINT 26 (MISCELLANEOUS) ×3 IMPLANT
CLIP VESOLOCK LG 6/CT PURPLE (CLIP) ×6 IMPLANT
CNTNR URN SCR LID CUP LEK RST (MISCELLANEOUS) ×2 IMPLANT
CONT SPEC 4OZ STRL OR WHT (MISCELLANEOUS) ×3
COVER SURGICAL LIGHT HANDLE (MISCELLANEOUS) ×3 IMPLANT
COVER TIP SHEARS 8 DVNC (MISCELLANEOUS) ×2 IMPLANT
COVER TIP SHEARS 8MM DA VINCI (MISCELLANEOUS) ×3
COVER WAND RF STERILE (DRAPES) IMPLANT
CUTTER ECHEON FLEX ENDO 45 340 (ENDOMECHANICALS) ×3 IMPLANT
DECANTER SPIKE VIAL GLASS SM (MISCELLANEOUS) ×3 IMPLANT
DERMABOND ADVANCED (GAUZE/BANDAGES/DRESSINGS) ×1
DERMABOND ADVANCED .7 DNX12 (GAUZE/BANDAGES/DRESSINGS) ×2 IMPLANT
DRAIN CHANNEL RND F F (WOUND CARE) IMPLANT
DRAPE ARM DVNC X/XI (DISPOSABLE) ×8 IMPLANT
DRAPE COLUMN DVNC XI (DISPOSABLE) ×2 IMPLANT
DRAPE DA VINCI XI ARM (DISPOSABLE) ×12
DRAPE DA VINCI XI COLUMN (DISPOSABLE) ×3
DRAPE SURG IRRIG POUCH 19X23 (DRAPES) ×3 IMPLANT
DRSG TEGADERM 4X4.75 (GAUZE/BANDAGES/DRESSINGS) ×3 IMPLANT
ELECT REM PT RETURN 15FT ADLT (MISCELLANEOUS) ×3 IMPLANT
GAUZE 4X4 16PLY RFD (DISPOSABLE) IMPLANT
GAUZE SPONGE 2X2 8PLY STRL LF (GAUZE/BANDAGES/DRESSINGS) IMPLANT
GLOVE BIO SURGEON STRL SZ 6.5 (GLOVE) ×3 IMPLANT
GLOVE BIOGEL M STRL SZ7.5 (GLOVE) ×6 IMPLANT
GLOVE BIOGEL PI IND STRL 7.5 (GLOVE) ×2 IMPLANT
GLOVE BIOGEL PI INDICATOR 7.5 (GLOVE) ×1
GOWN STRL REUS W/TWL LRG LVL3 (GOWN DISPOSABLE) ×9 IMPLANT
HOLDER FOLEY CATH W/STRAP (MISCELLANEOUS) ×3 IMPLANT
IRRIG SUCT STRYKERFLOW 2 WTIP (MISCELLANEOUS) ×3
IRRIGATION SUCT STRKRFLW 2 WTP (MISCELLANEOUS) ×2 IMPLANT
IV LACTATED RINGERS 1000ML (IV SOLUTION) ×3 IMPLANT
KIT PROCEDURE DA VINCI SI (MISCELLANEOUS) ×3
KIT PROCEDURE DVNC SI (MISCELLANEOUS) ×2 IMPLANT
KIT TURNOVER KIT A (KITS) IMPLANT
NEEDLE INSUFFLATION 14GA 120MM (NEEDLE) ×3 IMPLANT
NEEDLE SPNL 22GX7 QUINCKE BK (NEEDLE) ×3 IMPLANT
PACK ROBOTIC CUSTOM UROLOGY (CUSTOM PROCEDURE TRAY) ×3 IMPLANT
PAD POSITIONING PINK XL (MISCELLANEOUS) ×3 IMPLANT
PENCIL SMOKE EVACUATOR (MISCELLANEOUS) IMPLANT
PORT ACCESS TROCAR AIRSEAL 12 (TROCAR) ×2 IMPLANT
PORT ACCESS TROCAR AIRSEAL 5M (TROCAR) ×1
SEAL CANN UNIV 5-8 DVNC XI (MISCELLANEOUS) ×8 IMPLANT
SEAL XI 5MM-8MM UNIVERSAL (MISCELLANEOUS) ×12
SET TRI-LUMEN FLTR TB AIRSEAL (TUBING) ×3 IMPLANT
SOLUTION ELECTROLUBE (MISCELLANEOUS) ×3 IMPLANT
SPONGE GAUZE 2X2 STER 10/PKG (GAUZE/BANDAGES/DRESSINGS)
SPONGE LAP 4X18 RFD (DISPOSABLE) ×3 IMPLANT
STAPLE RELOAD 45 GRN (STAPLE) ×2 IMPLANT
STAPLE RELOAD 45MM GREEN (STAPLE) ×3
SUT ETHILON 3 0 PS 1 (SUTURE) ×3 IMPLANT
SUT MNCRL AB 4-0 PS2 18 (SUTURE) ×6 IMPLANT
SUT PDS AB 1 CT1 27 (SUTURE) ×6 IMPLANT
SUT VIC AB 2-0 SH 27 (SUTURE) ×3
SUT VIC AB 2-0 SH 27X BRD (SUTURE) ×2 IMPLANT
SUT VICRYL 0 UR6 27IN ABS (SUTURE) ×3 IMPLANT
SUT VLOC BARB 180 ABS3/0GR12 (SUTURE) ×9
SUTURE VLOC BRB 180 ABS3/0GR12 (SUTURE) ×6 IMPLANT
SYR 27GX1/2 1ML LL SAFETY (SYRINGE) ×3 IMPLANT
TOWEL OR NON WOVEN STRL DISP B (DISPOSABLE) ×3 IMPLANT
TROCAR XCEL NON-BLD 5MMX100MML (ENDOMECHANICALS) IMPLANT
WATER STERILE IRR 1000ML POUR (IV SOLUTION) ×3 IMPLANT

## 2019-11-29 NOTE — Anesthesia Postprocedure Evaluation (Signed)
Anesthesia Post Note  Patient: Kenneth Shaw  Procedure(s) Performed: XI ROBOTIC ASSISTED LAPAROSCOPIC RADICAL PROSTATECTOMY (N/A ) WITH INJECTION ICG DYE AND LYMPHADENECTOMY (Bilateral )     Patient location during evaluation: PACU Anesthesia Type: General Level of consciousness: sedated and patient cooperative Pain management: pain level controlled Vital Signs Assessment: post-procedure vital signs reviewed and stable Respiratory status: spontaneous breathing Cardiovascular status: stable Anesthetic complications: no   No complications documented.  Last Vitals:  Vitals:   11/29/19 1200 11/29/19 1225  BP:  138/72  Pulse:  67  Resp:  20  Temp:  36.8 C  SpO2: 100% 100%    Last Pain:  Vitals:   11/29/19 1538  TempSrc:   PainSc: [place]

## 2019-11-29 NOTE — Transfer of Care (Signed)
Immediate Anesthesia Transfer of Care Note  Patient: GOKUL WAYBRIGHT  Procedure(s) Performed: XI ROBOTIC ASSISTED LAPAROSCOPIC RADICAL PROSTATECTOMY (N/A ) WITH INJECTION ICG DYE AND LYMPHADENECTOMY (Bilateral )  Patient Location: PACU  Anesthesia Type:General  Level of Consciousness: awake, alert , oriented and patient cooperative  Airway & Oxygen Therapy: Patient Spontanous Breathing and Patient connected to face mask oxygen  Post-op Assessment: Report given to RN, Post -op Vital signs reviewed and stable and Patient moving all extremities  Post vital signs: Reviewed and stable  Last Vitals:  Vitals Value Taken Time  BP    Temp    Pulse    Resp 14 11/29/19 1044  SpO2 91 % 11/29/19 1043  Vitals shown include unvalidated device data.  Last Pain:  Vitals:   11/29/19 0550  TempSrc: Oral  PainSc:       Patients Stated Pain Goal: 4 (20/23/34 3568)  Complications: No complications documented.

## 2019-11-29 NOTE — Discharge Instructions (Signed)

## 2019-11-29 NOTE — Anesthesia Procedure Notes (Signed)

## 2019-11-29 NOTE — H&P (Signed)
Kenneth Shaw is an 64 y.o. male.    Chief Complaint: Pre-OP Prostatecotmy  HPI:   1 - Low Risk Prostate Cancer - 2/12 cores Gleason 6 disease up to 25% of LLA, LLB on eval rising PSA to 4.6. TRUS BX 57 mL with modest median lobe.   Recent Surveillance:  02/2018 - PSA 4.03 / DRE 50gm smooth  08/2018 - PSA 3.68 / MRI 11mL with P4 lesions x 2 (0.82mL each) left mid area, Uronav data sent; 02/2019 PSA 3.18 / DRE 50gm smooth  08/2019 - PSA 4.55 ==> MRI Fusion + template BX 1 core 62% grade 1 disase (LLA) and ROI 5% grade 1 (also left), 79 mL, stable median lobe. ==> ADD finasteride.   PMH sig for HLD, Duptryens. His PCP is Kenneth Bowen MD.   Today "Kenneth Shaw" is seen to proceed with radical prostatecotmy for progressive volume prostate cancer with progressive lower urinary tract symptoms. No interval fevers. C19 screen negative. Hgb 15, Cr <1.    Past Medical History:  Diagnosis Date  . ADD (attention deficit disorder)    no official testing  . Anisocoria    chronic  . Arthritis    HISTORY OF  . Basal cell cancer    Dr Kenneth Shaw  . Diverticulosis   . Dupuytren's contracture of both hands   . fracture as child   left arm no surgery  . Goiter    SMALL  . History of kidney stones   . History of sleep apnea    BiPAP  . Hyperlipidemia   . Hypothyroidism   . Nonspecific ST-T changes    early repolarization  . Pre-diabetes   . Prostate cancer ([place])   . Sleep apnea    bipap 10.7 inhale 7 on exhale    Past Surgical History:  Procedure Laterality Date  . COLONOSCOPY     Tics; [facility] x2  . HERNIA REPAIR  8- 10 yrs ago   bilateral inguinal  . KNEE ARTHROSCOPY W/ ACL RECONSTRUCTION  1980   Left Knee   . KNEE SURGERY Left 2008   arthroscopy L knee, [facility]; Dr Kenneth Shaw  . THRYOID BIOPSY    . TONSILLECTOMY  as child  . TOTAL KNEE ARTHROPLASTY  04/19/2012   Procedure: TOTAL KNEE ARTHROPLASTY;  Surgeon: Kenneth Alf, MD;  Location: [facility];  Service: Orthopedics;  Laterality: Left;   Marland Kitchen VASECTOMY      Family History  Problem Relation Age of Onset  . Diabetes Maternal Grandmother   . Ovarian cancer Maternal Aunt   . Sleep apnea Father   . Bipolar disorder Sister   . Hyperlipidemia Mother   . Hypertension Mother   . Thyroid disease Mother        hypothyroidism  . Atrial fibrillation Mother   . Heart attack Maternal Uncle        3 M uncles mid 19s  . Heart attack Maternal Grandfather        in 48s  . Bone cancer Paternal Grandmother   . Bone cancer Paternal Uncle   . Stroke Paternal Grandfather        cns aneurysm  . Prostate cancer Neg Hx   . Colon cancer Neg Hx    Social History:  reports that he quit smoking about 37 years ago. His smoking use included cigarettes. He has a 16.00 pack-year smoking history. He has never used smokeless tobacco. He reports that he does not drink alcohol and does not use drugs.  Allergies:  Allergies  Allergen Reactions  . Xarelto [Rivaroxaban] Rash    Medications Prior to Admission  Medication Sig Dispense Refill  . aspirin EC 81 MG tablet Take 81 mg by mouth at bedtime.    . Cholecalciferol (VITAMIN D3) 250 MCG (10000 UT) TABS Take 10,000 Units by mouth every evening.    . Coenzyme Q10 (COQ10 PO) Take 1 capsule by mouth at bedtime.    . EUTHYROX 100 MCG tablet Take 100 mcg by mouth daily before breakfast.     . ezetimibe (ZETIA) 10 MG tablet Take 10 mg by mouth daily.    Marland Kitchen MAGNESIUM PO Take 1 tablet by mouth at bedtime.    . Multiple Vitamin (MULTIVITAMIN WITH MINERALS) TABS tablet Take 1 tablet by mouth at bedtime. Men's Multivitamin    . rosuvastatin (CRESTOR) 5 MG tablet Take 5 mg by mouth every Sunday.     . tamsulosin (FLOMAX) 0.4 MG CAPS capsule Take 0.4 mg by mouth daily.     Marland Kitchen VITAMIN E PO Take 2 capsules by mouth at bedtime.    . Zinc 50 MG TABS Take 100 mg by mouth every evening.    . NON FORMULARY BIPAP MACHINE      Results for orders placed or performed during the hospital encounter of 11/29/19 (from the  past 48 hour(s))  Glucose, capillary     Status: Abnormal   Collection Time: 11/29/19  6:10 AM  Result Value Ref Range   Glucose-Capillary 119 (H) 70 - 99 mg/dL    Comment: Glucose reference range applies only to samples taken after fasting for at least 8 hours.   No results found.  Review of Systems  Constitutional: Negative for appetite change and fatigue.  Genitourinary: Positive for urgency.  All other systems reviewed and are negative.   Blood pressure 123/73, pulse (!) 58, temperature 98.5 F (36.9 C), temperature source Oral, resp. rate 18, height 6' 3.5" (1.918 m), weight 102.1 kg, SpO2 100 %. Physical Exam  Vitals reviewed. HENT:  Head: Normocephalic.  Nose: Nose normal.  Cardiovascular: Normal rate and normal pulses.  Respiratory: Effort normal.  Genitourinary:    Genitourinary Comments: No CVAT   Musculoskeletal:        General: Normal range of motion.     Cervical back: Normal range of motion.  Neurological: He is alert.  Skin: Skin is warm.     Assessment/Plan  Proceed as planned with prostatectomy with limited node dissection. Risks, benefits, alternatives, expected peri-op course discussed extensively previously and reiterated today.   Kenneth Frock, MD 11/29/2019, 6:47 AM

## 2019-11-29 NOTE — Brief Op Note (Signed)
11/29/2019  10:25 AM  PATIENT:  Kenneth Shaw  64 y.o. male  PRE-OPERATIVE DIAGNOSIS:  PROSTATE CANCER  POST-OPERATIVE DIAGNOSIS:  PROSTATE CANCER  PROCEDURE:  Procedure(s) with comments: XI ROBOTIC ASSISTED LAPAROSCOPIC RADICAL PROSTATECTOMY (N/A) - 3 HRS WITH INJECTION ICG DYE AND LYMPHADENECTOMY (Bilateral)  SURGEON:  Surgeon(s) and Role:    * Alexis Frock, MD - Primary  PHYSICIAN ASSISTANT:   ASSISTANTS: Carmie Kanner MD   ANESTHESIA:   local and general  EBL:  150 mL   BLOOD ADMINISTERED:none  DRAINS: 1 - JP to bulb; 2- Foley to gravity   LOCAL MEDICATIONS USED:  MARCAINE     SPECIMEN:  Source of Specimen:  1 - periprostatic fat; 2- prostatectomy; 3 - pelvic lymph nodes  DISPOSITION OF SPECIMEN:  PATHOLOGY  COUNTS:  YES  TOURNIQUET:  * No tourniquets in log *  DICTATION: .Other Dictation: Dictation Number  [id number]  PLAN OF CARE: Admit for overnight observation  PATIENT DISPOSITION:  PACU - hemodynamically stable.   Delay start of Pharmacological VTE agent (>24hrs) due to surgical blood loss or risk of bleeding: yes

## 2019-11-29 NOTE — Op Note (Signed)
NAME: Kenneth Shaw, Kenneth Shaw MEDICAL RECORD OI:[id number] ACCOUNT [id number] DATE OF BIRTH:1955/11/16 FACILITY: [facility] LOCATION: [facility] PHYSICIAN:Cady Hafen Tresa Moore, MD  OPERATIVE REPORT  DATE OF PROCEDURE:  11/29/2019  SURGEON:  Alexis Frock MD  PREOPERATIVE DIAGNOSIS:  Low-risk prostate cancer with progression and refractory lower urinary tract symptoms.  PROCEDURE:   1.  Robotic-assisted laparoscopic radical prostatectomy. 2.  Bilateral sentinel lymph node dissection with indocyanine green dye.  ESTIMATED BLOOD LOSS:  150 mL.  MEDICATIONS:  None.  SPECIMENS: 1.  Periprostatic fat. 2.  Right external iliac lymph nodes. 3.  Right obturator lymph nodes. 4.  Left external iliac lymph nodes, sentinel 5.  Left obturator lymph nodes for permanent pathology. 6.  Radical prostatectomy.  FINDINGS: 1.  Sentinel lymph nodes in the left external iliac group. 2.  Small median lobe as anticipated.  INDICATIONS:  The patient is a very pleasant 64 year old man with longstanding history of elevated PSA and obstructive voiding symptoms.  He was found to have low-risk adenocarcinoma of the prostate several years ago.  He was very appropriately placed on  a surveillance protocol.  He has been very compliant with this.  He has, however, developed progression of the volume of the disease per protocol and prostate biopsy as well as some progression of his benign prostatic hypertrophy and obstructive voiding  symptoms that has been difficult to control with medications.  Options were discussed for management, including continued medical therapy and surveillance versus more definitive therapy with surgical extirpation preferred given his obstructive voiding  and he wished to proceed with prostatectomy with curative intent.  Informed consent was obtained and placed in the medical record.  DESCRIPTION OF PROCEDURE:  The patient being identified, the procedure being radical prostatectomy was confirmed.  Procedure  timeout was performed.  Intravenous antibiotics were administered.  General endotracheal anesthesia induced.  The patient was  placed into a low lithotomy position.  Sterile field was created by prepping and draping the patient's penis, perineum and proximal thighs using iodine and his infra-xiphoid abdomen utilizing gluconate.  He was further fastened to the operative table  using 3-inch tape over foam padding across the supraxiphoid chest.  His arms were tucked at his side using gel rolls.  A test of steep Trendelenburg positioning was performed and found to be suitably positioned.  Next, a high-flow, low-pressure  pneumoperitoneum was obtained using Veress technique in the supraumbilical midline, having passed the aspiration and drop test and after Foley catheter was placed free to straight drain.  An 8 mm robotic camera port was then placed in same location.   Laparoscopic examination of peritoneal cavity revealed no significant adhesions and no visceral injury.  Additional ports were placed as follows:  Right paramedian 8 mm robotic port, right far lateral 12 mm AirSeal assist port, right paramedian 5 mm  suction port, left paramedian 8 mm robotic port, left far lateral 8 mm robotic port.  Robot was docked and passed the electronic checks.  Initial attention was directed at development of the space of Retzius.  Incision was made lateral to the right  medial umbilical ligament from the midline towards the area of the internal ring, coursing along the iliac vessels towards the area of the right ureter and the right bladder wall was swept away from the pelvic sidewall towards the area of the endopelvic  fascia on the right side.  Right vas deferens was encountered during these maneuvers and purposely ligated using medial bucket handle.  A mirror image dissection was performed on  the left side.  Anterior attachments were taken down using cautery  scissors.  This exposed the anterior base of the prostate,  which was defatted to better denote the prostate-bladder neck junction.  This fat was set aside and labeled as periprosthetic fat.  Next, 0.2 mL  of indocyanine green dye was injected in each  lobe of the prostate using a percutaneously placed robotically guided spinal needle with intervening suctioning to prevent dye spillage, which did not occur.  Next, the endopelvic fascia was carefully swept away from the lateral aspect of the prostate in  a base to apex orientation.  This exposed the dorsal venous complex which was carefully controlled using a green load stapler.  Then, approximately 10 minutes post-dye injection, the pelvis was inspected under near infrared fluorescence light.  Sentinel lymphangiography revealed excellent parenchymal uptake of the prostate with several lymphatic channels coursing towards the iliac and pelvic lymph node fields, predominately on the left side.  There was no obvious sentinel lymph nodes in the  right.  As such, a template lymphadenectomy was performed on the right side, first the right external iliac group with boundaries being right external iliac artery, vein, pelvic sidewall, iliac bifurcation.  Lymphostasis was achieved with cold clips, set  aside labeled right external iliac lymph nodes.  Next, the right obturator group was dissected free with the boundaries being right external iliac vein, pelvic sidewall, obturator nerve.  Lymphostasis was achieved with cold clips, set aside labeled  right obturator lymph nodes.  The right obturator nerve was inspected following these maneuvers and found to be uninjured.  Next, left-sided pelvic lymphadenectomy was performed of the same left external and left obturator group respectively using the  same boundaries as per the right.  There was a dominant sentinel lymphatic channel in this dominant sentinel lymph node within the left external iliac packet and this was labeled as such.  The left obturator nerve was inspected  following maneuvers and  found to be uninjured.  Then, attention was directed at bladder neck dissection.  A lateral release was performed on each side to better denote the bladder neck caliber and the bladder neck was carefully separated from the base of the prostate in  anterior, posterior direction.  The patient did have a known small median lobe and very careful dissection was performed in the area, incising the mucosa of the posterior plane and purposely approximately 7 mm more inferior as per typical which allowed  the median lobe to be contained and with the prostatectomy specimen.  Posterior dissection was performed by incising approximately 7 mm inferior posterior to the posterior lobe of the prostate, entering the plane of Denonvilliers.  Bilateral vas deferens  were encountered, dissected for a distance of approximately 4 cm, ligated and placed on gentle superior traction.  Bilateral seminal vesicles were dissected to their tips and placed on gentle superior traction.  Dissection proceeded within this inferior  plane towards the area of the apex of the prostate.  This exposed the vascular pedicles on each side, which were controlled using a sequential clipping technique in a base to apex orientation.  There was moderate nerve sparing performed bilaterally.   Final apical dissection was performed in the anterior plane by placing the prostate on superior traction and transecting the membranous urethra coldly.  This completely freed the prostatectomy specimen which was placed in an EndoCatch bag for later  retrieval.  Next, digital rectal exam was performed using indicator glove under laparoscopic vision.  No  evidence of rectal violation was noted.  Attention was then directed at posterior reconstruction.  A 3-0 V-Loc suture was used to reapproximate the  posterior urethral plate, the posterior bladder neck, bringing these structures into tension tension-free apposition.  A mucosa-to-mucosa  anastomosis was performed using double arm 3-0 V-Loc suture from the 6 o'clock to 12 o'clock position.  This  resulted in excellent tension-free apposition.  A new Foley catheter was placed per urethra, which irrigated quantitatively.  Sponge and needle counts were correct.  Hemostasis appeared excellent.  A closed suction drain was brought through the previous  left lateral most robotic port site into the peritoneal cavity.  Robot was then undocked.  The previous right lateral most 12 mm assistant port site was closed at the level of the fascia using Carter-Thomason suture passer and 0 Vicryl.  The specimen was  retrieved by extending the previous camera port site superiorly for a distance of approximately 4 cm, removing the prostatectomy specimen, setting it aside for permanent pathology.  The site was then closed at the level of the fascia using  figure-of-eight PDS x3, followed by reapproximation of Scarpa's with a running Vicryl.  All incision sites were infiltrated with dilute lipolyzed Marcaine and closed at the level of the skin using subcuticular Monocryl, followed by Dermabond.  The  procedure was then terminated.  The patient tolerated the procedure well.  No immediate perioperative complications.  The patient was taken to postanesthesia care unit in stable condition.  Plan for observation admission.    ASSISTANT:   Carmie Kanner MD was crucial for all portions of the surgery today.  She provided invaluable retraction, suctioning, specimen manipulation, vascular clipping, vascular stapling and general first assistance.  VN/NUANCE  D:11/29/2019 T:11/29/2019 JOB:[id number]

## 2019-11-29 NOTE — Anesthesia Preprocedure Evaluation (Addendum)
Anesthesia Evaluation  Patient identified by MRN, date of birth, ID band Patient awake    Reviewed: Allergy & Precautions, NPO status , Patient's Chart, lab work & pertinent test results  Airway Mallampati: II  TM Distance: >3 FB Neck ROM: Full    Dental  (+) Dental Advisory Given, Teeth Intact   Pulmonary sleep apnea , former smoker,    Pulmonary exam normal breath sounds clear to auscultation       Cardiovascular negative cardio ROS Normal cardiovascular exam Rhythm:Regular Rate:Normal     Neuro/Psych PSYCHIATRIC DISORDERS negative neurological ROS     GI/Hepatic negative GI ROS, Neg liver ROS,   Endo/Other  Hypothyroidism   Renal/GU negative Renal ROS     Musculoskeletal  (+) Arthritis ,   Abdominal (+) + obese,   Peds  Hematology negative hematology ROS (+)   Anesthesia Other Findings   Reproductive/Obstetrics                            Anesthesia Physical Anesthesia Plan  ASA: II  Anesthesia Plan: General   Post-op Pain Management:    Induction: Intravenous  PONV Risk Score and Plan: 4 or greater and Ondansetron, Dexamethasone, Midazolam and Treatment may vary due to age or medical condition  Airway Management Planned: Oral ETT  Additional Equipment:   Intra-op Plan:   Post-operative Plan: Extubation in OR  Informed Consent: I have reviewed the patients History and Physical, chart, labs and discussed the procedure including the risks, benefits and alternatives for the proposed anesthesia with the patient or authorized representative who has indicated his/her understanding and acceptance.     Dental advisory given  Plan Discussed with: CRNA  Anesthesia Plan Comments:        Anesthesia Quick Evaluation

## 2019-11-30 ENCOUNTER — Encounter (HOSPITAL_COMMUNITY): Payer: Self-pay | Admitting: Urology

## 2019-11-30 DIAGNOSIS — Z79899 Other long term (current) drug therapy: Secondary | ICD-10-CM | POA: Diagnosis not present

## 2019-11-30 DIAGNOSIS — E039 Hypothyroidism, unspecified: Secondary | ICD-10-CM | POA: Diagnosis not present

## 2019-11-30 DIAGNOSIS — N401 Enlarged prostate with lower urinary tract symptoms: Secondary | ICD-10-CM | POA: Diagnosis not present

## 2019-11-30 DIAGNOSIS — N138 Other obstructive and reflux uropathy: Secondary | ICD-10-CM | POA: Diagnosis not present

## 2019-11-30 DIAGNOSIS — Z87891 Personal history of nicotine dependence: Secondary | ICD-10-CM | POA: Diagnosis not present

## 2019-11-30 DIAGNOSIS — C61 Malignant neoplasm of prostate: Secondary | ICD-10-CM | POA: Diagnosis not present

## 2019-11-30 DIAGNOSIS — M1990 Unspecified osteoarthritis, unspecified site: Secondary | ICD-10-CM | POA: Diagnosis not present

## 2019-11-30 DIAGNOSIS — Z85828 Personal history of other malignant neoplasm of skin: Secondary | ICD-10-CM | POA: Diagnosis not present

## 2019-11-30 DIAGNOSIS — E785 Hyperlipidemia, unspecified: Secondary | ICD-10-CM | POA: Diagnosis not present

## 2019-11-30 DIAGNOSIS — Z8349 Family history of other endocrine, nutritional and metabolic diseases: Secondary | ICD-10-CM | POA: Diagnosis not present

## 2019-11-30 DIAGNOSIS — Z79890 Hormone replacement therapy (postmenopausal): Secondary | ICD-10-CM | POA: Diagnosis not present

## 2019-11-30 DIAGNOSIS — G4730 Sleep apnea, unspecified: Secondary | ICD-10-CM | POA: Diagnosis not present

## 2019-11-30 DIAGNOSIS — Z96652 Presence of left artificial knee joint: Secondary | ICD-10-CM | POA: Diagnosis not present

## 2019-11-30 DIAGNOSIS — Z7982 Long term (current) use of aspirin: Secondary | ICD-10-CM | POA: Diagnosis not present

## 2019-11-30 LAB — BASIC METABOLIC PANEL
Anion gap: 7 (ref 5–15)
BUN: 10 mg/dL (ref 8–23)
CO2: 24 mmol/L (ref 22–32)
Calcium: 8.6 mg/dL — ABNORMAL LOW (ref 8.9–10.3)
Chloride: 108 mmol/L (ref 98–111)
Creatinine, Ser: 0.84 mg/dL (ref 0.61–1.24)
GFR calc Af Amer: >60 mL/min (ref >60)
GFR calc non Af Amer: >60 mL/min (ref >60)
Glucose, Bld: 147 mg/dL — ABNORMAL HIGH (ref 70–99)
Potassium: 4.1 mmol/L (ref 3.5–5.1)
Sodium: 139 mmol/L (ref 135–145)

## 2019-11-30 LAB — HEMOGLOBIN AND HEMATOCRIT, BLOOD
HCT: 37.3 % — ABNORMAL LOW (ref 39.0–52.0)
Hemoglobin: 12.8 g/dL — ABNORMAL LOW (ref 13.0–17.0)

## 2019-11-30 NOTE — Discharge Summary (Signed)
Physician Discharge Summary  Patient ID: Kenneth Shaw MRN: [id number] DOB/AGE: August 26, 1955 64 y.o.  Admit date: 11/29/2019 Discharge date: 11/30/2019  Admission Diagnoses:  Discharge Diagnoses:  Active Problems:   Prostate cancer [facility])   Discharged Condition: good  Hospital Course: Patient underwent a robotic assisted laparoscopic prostatectomy with bilateral pelvic lymph node dissection on 6/25.  He did very well overnight.  Labs were stable.  JP output was 125 overnight but has been relatively low since it was emptied at 6:30 AM.  He has had great urine output.  He was ambulating and doing well.  He was discharged home in stable condition  Consults: None  Significant Diagnostic Studies: None  Treatments: surgery: As above  Discharge Exam: Blood pressure 122/67, pulse 65, temperature 98.7 F (37.1 C), temperature source Oral, resp. rate 17, height 6' 3.5" (1.918 m), weight 102.1 kg, SpO2 98 %. General appearance: alert, no acute distress Adequate perfusion of extremities Nonlabored respiration JP serosanguineous Abdomen soft, nontender, nondistended.  Incisions clean dry and intact. Foley catheter in place draining clear yellow urine  Disposition: Discharge disposition: 01-Home or Self Care        Allergies as of 11/30/2019      Reactions   Xarelto [rivaroxaban] Rash      Medication List    STOP taking these medications   tamsulosin 0.4 MG Caps capsule Commonly known as: FLOMAX     TAKE these medications   aspirin EC 81 MG tablet Take 81 mg by mouth at bedtime.   COQ10 PO Take 1 capsule by mouth at bedtime.   Euthyrox 100 MCG tablet Generic drug: levothyroxine Take 100 mcg by mouth daily before breakfast.   ezetimibe 10 MG tablet Commonly known as: ZETIA Take 10 mg by mouth daily.   HYDROcodone-acetaminophen 5-325 MG tablet Commonly known as: NORCO/VICODIN Take 1 tablet by mouth every 6 (six) hours as needed for moderate pain or severe pain.  Post-operatively   MAGNESIUM PO Take 1 tablet by mouth at bedtime.   multivitamin with minerals Tabs tablet Take 1 tablet by mouth at bedtime. Men's Multivitamin   NON FORMULARY BIPAP MACHINE   rosuvastatin 5 MG tablet Commonly known as: CRESTOR Take 5 mg by mouth every Sunday.   senna-docusate 8.6-50 MG tablet Commonly known as: Senokot-S Take 1 tablet by mouth 2 (two) times daily. While taking strong pain meds to prevent constipation   sulfamethoxazole-trimethoprim 800-160 MG tablet Commonly known as: BACTRIM DS Take 1 tablet by mouth 2 (two) times daily. X 3 days. Begin day before next Urology appointment.   Vitamin D3 250 MCG (10000 UT) Tabs Take 10,000 Units by mouth every evening.   VITAMIN E PO Take 2 capsules by mouth at bedtime.   Zinc 50 MG Tabs Take 100 mg by mouth every evening.       Follow-up Information    Alexis Frock, MD On 12/10/2019.   Specialty: Urology Why: at 7:45 for MD visit and office catheter removal.  Contact information: [place] [id number] [phone]               Signed: Marton Redwood, III 11/30/2019, 11:34 AM

## 2019-11-30 NOTE — Progress Notes (Signed)
Went over discharge papers with patient and family.  All questions answered.  VSS.  JP removed.  Foley and leg bag teaching completed with teach back.  AVS given to patient.

## 2019-12-03 LAB — SURGICAL PATHOLOGY

## 2019-12-10 DIAGNOSIS — N393 Stress incontinence (female) (male): Secondary | ICD-10-CM | POA: Diagnosis not present

## 2020-04-06 DIAGNOSIS — Z8616 Personal history of COVID-19: Secondary | ICD-10-CM

## 2020-04-06 HISTORY — DX: Personal history of COVID-19: Z86.16

## 2020-04-22 ENCOUNTER — Ambulatory Visit: Payer: BC Managed Care – PPO | Admitting: Dermatology

## 2020-08-24 ENCOUNTER — Other Ambulatory Visit: Payer: Self-pay

## 2020-08-24 ENCOUNTER — Ambulatory Visit (INDEPENDENT_AMBULATORY_CARE_PROVIDER_SITE_OTHER): Payer: Medicare Other | Admitting: Dermatology

## 2020-08-24 ENCOUNTER — Encounter: Payer: Self-pay | Admitting: Dermatology

## 2020-08-24 DIAGNOSIS — C44319 Basal cell carcinoma of skin of other parts of face: Secondary | ICD-10-CM | POA: Diagnosis not present

## 2020-08-24 DIAGNOSIS — Z1283 Encounter for screening for malignant neoplasm of skin: Secondary | ICD-10-CM | POA: Diagnosis not present

## 2020-08-24 DIAGNOSIS — D485 Neoplasm of uncertain behavior of skin: Secondary | ICD-10-CM

## 2020-08-24 NOTE — Patient Instructions (Signed)

## 2020-09-01 ENCOUNTER — Telehealth: Payer: Self-pay | Admitting: Dermatology

## 2020-09-01 NOTE — Telephone Encounter (Signed)
-----   Message from Lavonna Monarch, MD sent at 09/01/2020  5:58 AM EDT ----- Schedule surgery with Dr. Darene Lamer

## 2020-09-01 NOTE — Telephone Encounter (Signed)
Pathology to patient tafeen first appointment is may- [place] so scheduled surgery once he returns in June.

## 2020-09-01 NOTE — Telephone Encounter (Signed)
Patient is calling for pathology results from last visit with Stuart Tafeen, MD 

## 2020-09-03 ENCOUNTER — Encounter: Payer: Self-pay | Admitting: Dermatology

## 2020-09-04 NOTE — Progress Notes (Signed)
   Follow-Up Visit   Subjective  Kenneth Shaw is a 65 y.o. male who presents for the following: Skin Problem (Patient has red lesion on right side of forehead. Painful to touch x months. Patient has previously used alcohol and iodine on lesion. ).  Growth right forehead, check moles on back. Location:  Duration:  Quality:  Associated Signs/Symptoms: Modifying Factors:  Severity:  Timing: Context:   Objective  Well appearing patient in no apparent distress; mood and affect are within normal limits. Objective  Right Forehead: Pink pearly centrally eroded 1.2 cm papule      Objective  Mid Back: Waist up examination, no atypical pigmented lesions.  Small actinic keratoses face, no intervention for now.    All skin waist up examined.   Assessment & Plan    Neoplasm of uncertain behavior of skin Right Forehead  Skin / nail biopsy Type of biopsy: tangential   Informed consent: discussed and consent obtained   Timeout: patient name, date of birth, surgical site, and procedure verified   Procedure prep:  Patient was prepped and draped in usual sterile fashion (Non sterile) Prep type:  Chlorhexidine Anesthesia: the lesion was anesthetized in a standard fashion   Anesthetic:  1% lidocaine w/ epinephrine 1-100,000 local infiltration Instrument used: flexible razor blade   Outcome: patient tolerated procedure well   Post-procedure details: wound care instructions given    Specimen 1 - Surgical pathology Differential Diagnosis: bcc vs scc  Check Margins: No  Encounter for screening for malignant neoplasm of skin Mid Back  Annual skin examination, encouraged to self examine twice annually.  Continued ultraviolet protection.      I, Lavonna Monarch, MD, have reviewed all documentation for this visit.  The documentation on 09/04/20 for the exam, diagnosis, procedures, and orders are all accurate and complete.

## 2020-11-12 ENCOUNTER — Ambulatory Visit: Payer: Medicare Other | Admitting: Dermatology

## 2020-11-19 ENCOUNTER — Ambulatory Visit (INDEPENDENT_AMBULATORY_CARE_PROVIDER_SITE_OTHER): Payer: Medicare Other | Admitting: Dermatology

## 2020-11-19 ENCOUNTER — Encounter: Payer: Self-pay | Admitting: Dermatology

## 2020-11-19 ENCOUNTER — Other Ambulatory Visit: Payer: Self-pay

## 2020-11-19 DIAGNOSIS — C44319 Basal cell carcinoma of skin of other parts of face: Secondary | ICD-10-CM

## 2020-11-19 DIAGNOSIS — C4491 Basal cell carcinoma of skin, unspecified: Secondary | ICD-10-CM

## 2020-11-19 HISTORY — PX: OTHER SURGICAL HISTORY: SHX169

## 2020-11-19 NOTE — Patient Instructions (Signed)

## 2020-11-26 ENCOUNTER — Ambulatory Visit (INDEPENDENT_AMBULATORY_CARE_PROVIDER_SITE_OTHER): Payer: Medicare Other

## 2020-11-26 ENCOUNTER — Other Ambulatory Visit: Payer: Self-pay

## 2020-11-26 ENCOUNTER — Encounter: Payer: Self-pay | Admitting: Dermatology

## 2020-11-26 DIAGNOSIS — Z4802 Encounter for removal of sutures: Secondary | ICD-10-CM

## 2020-11-26 NOTE — Progress Notes (Addendum)
Follow-Up Visit   Subjective  Kenneth Shaw is a 65 y.o. male who presents for the following: Procedure (Patient here today for BCC x 1 right forehead. ).  BCC forehead Location:  Duration:  Quality:  Associated Signs/Symptoms: Modifying Factors:  Severity:  Timing: Context: For treatment  Objective  Well appearing patient in no apparent distress; mood and affect are within normal limits. Right Forehead Lesion identified by Dr.Gracelin Weisberg and nurse in room.     A focused examination was performed including head and neck.. Relevant physical exam findings are noted in the Assessment and Plan.   Assessment & Plan    Basal cell carcinoma (BCC), unspecified site Right Forehead  Destruction of lesion Complexity: simple   Destruction method: electrodesiccation and curettage   Informed consent: discussed and consent obtained   Timeout:  patient name, date of birth, surgical site, and procedure verified Anesthesia: the lesion was anesthetized in a standard fashion   Anesthetic:  1% lidocaine w/ epinephrine 1-100,000 local infiltration Curettage performed in three different directions: Yes   Curettage cycles:  3 Lesion length (cm):  1.2 Lesion width (cm):  1.2 Margin per side (cm):  0.2 Final wound size (cm):  1.6 Hemostasis achieved with:  ferric subsulfate Outcome: patient tolerated procedure well with no complications   Post-procedure details: wound care instructions given   Additional details:  Wound innoculated with 5 fluorouracil solution.  Skin excision  Lesion length (cm):  2 Lesion width (cm):  1 Margin per side (cm):  0.1 Total excision diameter (cm):  2.2 Informed consent: discussed and consent obtained   Timeout: patient name, date of birth, surgical site, and procedure verified   Anesthesia: the lesion was anesthetized in a standard fashion   Anesthetic:  1% lidocaine w/ epinephrine 1-100,000 local infiltration Instrument used: #15 blade   Hemostasis  achieved with: pressure and electrodesiccation   Outcome: patient tolerated procedure well with no complications   Post-procedure details: sterile dressing applied and wound care instructions given   Dressing type: bandage and petrolatum    Skin repair Complexity:  Intermediate Final length (cm):  1 Informed consent: discussed and consent obtained   Timeout: patient name, date of birth, surgical site, and procedure verified   Procedure prep:  Patient was prepped and draped in usual sterile fashion (non sterile) Prep type:  Chlorhexidine Anesthesia: the lesion was anesthetized in a standard fashion   Reason for type of repair: reduce tension to allow closure and reduce the risk of dehiscence, infection, and necrosis   Undermining: edges undermined   Fine/surface layer approximation (top stitches):  Suture size:  6-0 Suture type: Vicryl (polyglactin 910)   Suture type comment:  Nylon Stitches: simple running   Suture removal (days):  8 Hemostasis achieved with: suture, pressure and electrodesiccation Outcome: patient tolerated procedure well with no complications   Post-procedure details: sterile dressing applied and wound care instructions given   Post-procedure details comment:  Non sterile pressure  Dressing type: bandage and petrolatum   Additional details:  Vicryl x 1 Ethilon x 3  Specimen 1 - Surgical pathology Differential Diagnosis: R/O [facility]  [id number] Check Margins: Yes (Lateral margin stained)  Triple curettage showed this to be a deep lesions so base and margins were cauterized, really curetted, and narrow margin excision with layered closure performed.     I, Lavonna Monarch, MD, have reviewed all documentation for this visit.  The documentation on 12/29/20 for the exam, diagnosis, procedures, and orders are all accurate and complete.

## 2020-11-26 NOTE — Progress Notes (Signed)
NTS Suture removal, No s/s of infection, path not back yet.

## 2020-12-24 ENCOUNTER — Encounter: Payer: Self-pay | Admitting: Nurse Practitioner

## 2021-01-26 ENCOUNTER — Other Ambulatory Visit (INDEPENDENT_AMBULATORY_CARE_PROVIDER_SITE_OTHER): Payer: Medicare Other

## 2021-01-26 ENCOUNTER — Ambulatory Visit (INDEPENDENT_AMBULATORY_CARE_PROVIDER_SITE_OTHER): Payer: Medicare Other | Admitting: Nurse Practitioner

## 2021-01-26 ENCOUNTER — Encounter: Payer: Self-pay | Admitting: Nurse Practitioner

## 2021-01-26 ENCOUNTER — Ambulatory Visit: Payer: Medicare Other | Admitting: Dermatology

## 2021-01-26 VITALS — BP 130/82 | HR 60 | Ht 75.5 in | Wt 251.0 lb

## 2021-01-26 DIAGNOSIS — K625 Hemorrhage of anus and rectum: Secondary | ICD-10-CM | POA: Diagnosis not present

## 2021-01-26 DIAGNOSIS — K644 Residual hemorrhoidal skin tags: Secondary | ICD-10-CM

## 2021-01-26 DIAGNOSIS — K648 Other hemorrhoids: Secondary | ICD-10-CM

## 2021-01-26 DIAGNOSIS — C61 Malignant neoplasm of prostate: Secondary | ICD-10-CM

## 2021-01-26 LAB — CBC
HCT: 43 % (ref 39.0–52.0)
Hemoglobin: 14.8 g/dL (ref 13.0–17.0)
MCHC: 34.4 g/dL (ref 30.0–36.0)
MCV: 92.2 fl (ref 78.0–100.0)
Platelets: 189 10*3/uL (ref 150.0–400.0)
RBC: 4.66 Mil/uL (ref 4.22–5.81)
RDW: 13.5 % (ref 11.5–15.5)
WBC: 6.4 10*3/uL (ref 4.0–10.5)

## 2021-01-26 NOTE — Patient Instructions (Addendum)
LABS:  Lab work has been ordered for you today. Our lab is located in the basement. Press "B" on the elevator. The lab is located at the first door on the left as you exit the elevator.  HEALTHCARE LAWS AND MY CHART RESULTS: Due to recent changes in healthcare laws, you may see the results of your imaging and laboratory studies on MyChart before your provider has had a chance to review them.   We understand that in some cases there may be results that are confusing or concerning to you. Not all laboratory results come back in the same time frame and the provider may be waiting for multiple results in order to interpret others.  Please give [place] 48 hours in order for your provider to thoroughly review all the results before contacting the office for clarification of your results.   PROCEDURES: You have been scheduled for a colonoscopy. Please follow the written instructions given to you at your visit today. If you use inhalers (even only as needed), please bring them with you on the day of your procedure.  RECOMMENDATIONS: Miralax- Dissolve one capful in 8 ounces of water and drink before bed. Benefiber- 1 tablespoon daily. Desitin: Apply a small amount to the external and internal anal area three times a day as needed.   It was great seeing you today! Thank you for entrusting me with your care and choosing [facility].  Noralyn Pick, CRNP  The [facility] providers would like to encourage you to use [facility] to communicate with providers for non-urgent requests or questions.  Due to long hold times on the telephone, sending your provider a message by [facility] may be faster and more efficient way to get a response. Please allow 48 business hours for a response.  Please remember that this is for non-urgent requests/questions.

## 2021-01-26 NOTE — Progress Notes (Signed)
01/26/2021 ASWAD MCMANUS [id number]:[id number] 1955-09-20   CHIEF COMPLAINT: Hemorrhoidal discomfort, rectal bleeding   HISTORY OF PRESENT ILLNESS:  Delbert Testani. Kuyper is a 65 year old male with a past medical history of ADD, arthritis, sleep apnea uses cpap, DM II diet controlled, prostate cancer initially diagnosed 07/2017 s/p robotic assisted laparoscopic radical prostatectomy 11/2019, basal cell carcinoma s/p excision right forehead, hypothyroidism and kidney stones.  He presents to our office today self-referred for further evaluation regarding anal discomfort and rectal bleeding which he attributes to having hemorrhoids as the age of 69.  He describes having intermittent episodes of anal discomfort with associated rectal bleeding.  He reports passing a small to moderate to large amount of bright red blood which he sees on the toilet tissue and sometimes in the commode, can turn the toilet water red at times. One year ago his rectal bleeding was more frequent, occurred several days monthly and over the past 6 months his rectal bleeding has decreased.  He last saw blood on the toilet tissue approximately 3 months ago.  Patiently strains to pass a normal formed brown bowel movement.  No abdominal pain.  No fever.  No weight loss.  He underwent colonoscopy 02/2006 showed sigmoid diverticulosis.  His most recent colonoscopy was 03/28/2016 which showed diverticulosis to the sigmoid colon, no polyps.  He stated he had similar rectal bleeding prior to his 03/2016 colonoscopy but he did not mention it to Dr. Carlean Purl at that time.  He wishes to consider internal hemorrhoid banding as he is frustrated regarding the chronic nature of his hemorrhoidal discomfort and bleeding.  Takes ASA 81 mg once daily.  Labs 11/30/2018 showed a low hemoglobin level of 12.8 which was collected during his hospitalization following his prostatectomy surgery. History of prostate cancer which was initially diagnosed 07/2017 and subsequently required  a robotic laparoscopic radical prostatectomy 11/2019.  No chemotherapy or radiation was required.   CBC Latest Ref Rng & Units 11/30/2019 11/29/2019 11/20/2019  WBC 4.0 - 10.5 K/uL - - 5.8  Hemoglobin 13.0 - 17.0 g/dL 12.8(L) 14.0 15.1  Hematocrit 39.0 - 52.0 % 37.3(L) 41.3 44.1  Platelets 150 - 400 K/uL - - 203    CMP Latest Ref Rng & Units 11/30/2019 11/20/2019 04/12/2018  Glucose 70 - 99 mg/dL 147(H) 118(H) 108(H)  BUN 8 - 23 mg/dL 10 22 24(H)  Creatinine 0.61 - 1.24 mg/dL 0.84 0.95 0.99  Sodium 135 - 145 mmol/L 139 140 141  Potassium 3.5 - 5.1 mmol/L 4.1 4.1 4.1  Chloride 98 - 111 mmol/L 108 106 106  CO2 22 - 32 mmol/L '24 24 29  '$ Calcium 8.9 - 10.3 mg/dL 8.6(L) 9.1 9.2  Total Protein 6.0 - 8.3 g/dL - - 6.5  Total Bilirubin 0.2 - 1.2 mg/dL - - 0.5  Alkaline Phos 39 - 117 U/L - - 47  AST 0 - 37 U/L - - 16  ALT 0 - 53 U/L - - 14     Colonoscopy 03/28/2016 by Dr. Carlean Purl: - Diverticulosis in the sigmoid colon. - The examination was otherwise normal on direct and retroflexion views. - No specimens collected.  Colonoscopy 03/03/2006: Sigmoid diverticulosis  Past Medical History:  Diagnosis Date   ADD (attention deficit disorder)    no official testing   Anisocoria    chronic   Arthritis    HISTORY OF   Basal cell cancer    Dr Denna Haggard   Diverticulosis    Dupuytren's contracture of both hands  fracture as child   left arm no surgery   Goiter    SMALL   History of kidney stones    History of sleep apnea    BiPAP   Hyperlipidemia    Hypothyroidism    Nonspecific ST-T changes    early repolarization   Pre-diabetes    Prostate cancer (HCC)    Sleep apnea    bipap 10.7 inhale 7 on exhale   Past Surgical History:  Procedure Laterality Date   COLONOSCOPY     Tics; [facility] x2   HERNIA REPAIR  8- 10 yrs ago   bilateral inguinal   KNEE ARTHROSCOPY W/ ACL RECONSTRUCTION  1980   Left Knee    KNEE SURGERY Left 2008   arthroscopy L knee, [facility]; Dr Tonita Cong    LYMPHADENECTOMY Bilateral 11/29/2019   Procedure: WITH INJECTION ICG DYE AND LYMPHADENECTOMY;  Surgeon: Alexis Frock, MD;  Location: [facility];  Service: Urology;  Laterality: Bilateral;   ROBOT ASSISTED LAPAROSCOPIC RADICAL PROSTATECTOMY N/A 11/29/2019   Procedure: XI ROBOTIC ASSISTED LAPAROSCOPIC RADICAL PROSTATECTOMY;  Surgeon: Alexis Frock, MD;  Location: [facility];  Service: Urology;  Laterality: N/A;  3 HRS   THRYOID BIOPSY     TONSILLECTOMY  as child   TOTAL KNEE ARTHROPLASTY  04/19/2012   Procedure: TOTAL KNEE ARTHROPLASTY;  Surgeon: Gearlean Alf, MD;  Location: [facility];  Service: Orthopedics;  Laterality: Left;   VASECTOMY     Social History: He is married.  He has 2 sons.  He is retired.  He quit smoking 1983. No alcohol use. No drug use.   Family History: Father with history of sleep apnea.  Mother with history of atrial fibrillation.  Sister with bipolar disorder.  Paternal grandmother and paternal uncle with history of bone cancer.  Maternal aunt with history of ovarian cancer.  Maternal grandfather grandfather and maternal uncle with history of heart disease.  No family history of esophageal, gastric or colon cancer.   Allergies  Allergen Reactions   Xarelto [Rivaroxaban] Rash      Outpatient Encounter Medications as of 01/26/2021  Medication Sig   aspirin EC 81 MG tablet Take 81 mg by mouth at bedtime.   Cholecalciferol (VITAMIN D3) 250 MCG (10000 UT) TABS Take 10,000 Units by mouth every evening.   Coenzyme Q10 (COQ10 PO) Take 1 capsule by mouth at bedtime.   EUTHYROX 100 MCG tablet Take 100 mcg by mouth daily before breakfast.    ezetimibe (ZETIA) 10 MG tablet Take 10 mg by mouth daily.   HYDROcodone-acetaminophen (NORCO/VICODIN) 5-325 MG tablet Take 1 tablet by mouth every 6 (six) hours as needed for moderate pain or severe pain. Post-operatively   MAGNESIUM PO Take 1 tablet by mouth at bedtime.   Multiple Vitamin (MULTIVITAMIN WITH MINERALS) TABS tablet Take 1 tablet by  mouth at bedtime. Men's Multivitamin   NON FORMULARY BIPAP MACHINE   rosuvastatin (CRESTOR) 5 MG tablet Take 5 mg by mouth every Sunday.    senna-docusate (SENOKOT-S) 8.6-50 MG tablet Take 1 tablet by mouth 2 (two) times daily. While taking strong pain meds to prevent constipation   sulfamethoxazole-trimethoprim (BACTRIM DS) 800-160 MG tablet Take 1 tablet by mouth 2 (two) times daily. X 3 days. Begin day before next Urology appointment.   VITAMIN E PO Take 2 capsules by mouth at bedtime.   Zinc 50 MG TABS Take 100 mg by mouth every evening.   No facility-administered encounter medications on file as of 01/26/2021.   REVIEW  OF SYSTEMS:  Gen: Denies fever, sweats or chills. No weight loss.  CV: Denies chest pain, palpitations or edema. Resp: Denies cough, shortness of breath of hemoptysis.  GI: See HPI. GU : Denies urinary burning, blood in urine, increased urinary frequency or incontinence. MS: Denies joint pain, muscles aches or weakness. Derm: Denies rash, itchiness, skin lesions or unhealing ulcers. Psych: Denies depression, anxiety, memory loss, suicidal ideation and confusion. Heme: Denies bruising, bleeding. Neuro:  Denies headaches, dizziness or paresthesias. Endo:  Denies any problems with DM, thyroid or adrenal function.  PHYSICAL EXAM: BP 130/82   Pulse 60   Ht 6' 3.5" (1.918 m)   Wt 251 lb (113.9 kg)   BMI 30.96 kg/m   General: 65 year old male in no acute distress. Head: Normocephalic and atraumatic. Eyes:  Sclerae non-icteric, conjunctive pink. Ears: Normal auditory acuity. Mouth: Dentition intact. No ulcers or lesions.  Neck: Supple, no lymphadenopathy or thyromegaly.  Lungs: Clear bilaterally to auscultation without wheezes, crackles or rhonchi. Heart: Regular rate and rhythm. No murmur, rub or gallop appreciated.  Abdomen: Soft, nontender, non distended. No masses. No hepatosplenomegaly. Normoactive bowel sounds x 4 quadrants.  Rectal: posterior external  hemorrhoids, prolapsed right lateral hemorrhoids friable, circumferential anal hemorrhoids. No blood or stool in rectal vault. No mass. Magda Paganini CMA present during exam.  Musculoskeletal: Symmetrical with no gross deformities. Skin: Warm and dry. No rash or lesions on visible extremities. Extremities: No edema. Neurological: Alert oriented x 4, no focal deficits.  Psychological:  Alert and cooperative. Normal mood and affect.  ASSESSMENT AND PLAN:  43) 65 year old male with rectal bleeding.  Internal and external hemorrhoids.  Colonoscopy in 2007 and 2017 showed diverticulosis.  -Colonoscopy prior to potential internal hemorrhoid banding. Colonoscopy to rule out colorectal polyps/colorectal malignancy,  benefits and risks discussed including risk with sedation, risk of bleeding, perforation and infection  -CBC -Benefiber 1 tablespoon daily -MiraLAX 1 capful mixed in 8 ounces of water at bedtime to avoid straining -Apply a small amount of Desitin inside the anal opening and to the external anal area tid as needed for anal or hemorrhoidal irritation/bleeding -Further recommendations to be determined after colonoscopy completed   2) History of prostate cancer s/p prostatectomy without chemotherapy or radiation 11/2019    CC:  [name], MD

## 2021-02-10 DIAGNOSIS — Z860101 Personal history of adenomatous and serrated colon polyps: Secondary | ICD-10-CM

## 2021-02-10 DIAGNOSIS — Z86010 Personal history of colonic polyps: Secondary | ICD-10-CM | POA: Insufficient documentation

## 2021-02-10 HISTORY — DX: Personal history of adenomatous and serrated colon polyps: Z86.0101

## 2021-02-10 HISTORY — DX: Personal history of colonic polyps: Z86.010

## 2021-02-11 ENCOUNTER — Ambulatory Visit (AMBULATORY_SURGERY_CENTER): Payer: Medicare Other | Admitting: Internal Medicine

## 2021-02-11 ENCOUNTER — Encounter: Payer: Self-pay | Admitting: Internal Medicine

## 2021-02-11 ENCOUNTER — Other Ambulatory Visit: Payer: Self-pay

## 2021-02-11 VITALS — BP 127/82 | HR 58 | Temp 98.4°F | Resp 12 | Ht 75.0 in | Wt 251.0 lb

## 2021-02-11 DIAGNOSIS — K5730 Diverticulosis of large intestine without perforation or abscess without bleeding: Secondary | ICD-10-CM | POA: Diagnosis not present

## 2021-02-11 DIAGNOSIS — D123 Benign neoplasm of transverse colon: Secondary | ICD-10-CM

## 2021-02-11 DIAGNOSIS — K625 Hemorrhage of anus and rectum: Secondary | ICD-10-CM | POA: Diagnosis present

## 2021-02-11 DIAGNOSIS — D120 Benign neoplasm of cecum: Secondary | ICD-10-CM | POA: Diagnosis not present

## 2021-02-11 DIAGNOSIS — K648 Other hemorrhoids: Secondary | ICD-10-CM | POA: Diagnosis not present

## 2021-02-11 MED ORDER — SODIUM CHLORIDE 0.9 % IV SOLN
500.0000 mL | Freq: Once | INTRAVENOUS | Status: DC
Start: 2021-02-11 — End: 2021-02-11

## 2021-02-11 NOTE — Progress Notes (Signed)
Called to room to assist during endoscopic procedure.  Patient ID and intended procedure confirmed with present staff. Received instructions for my participation in the procedure from the performing physician.  

## 2021-02-11 NOTE — Progress Notes (Signed)
PT taken to PACU. Monitors in place. VSS. Report given to RN. 

## 2021-02-11 NOTE — Patient Instructions (Addendum)
I found and removed 3 tiny polyps.  You are right - the hemorrhoids are causing the bleeding.  You also have a condition called diverticulosis - common and not usually a problem. Please read the handout provided.  I will let you know pathology results and when to have another routine colonoscopy by mail and/or My Chart.  My office will schedule a hemorrhoid banding appointment. Stay tuned for a call about that.  I appreciate the opportunity to care for you. Gatha Mayer, MD, [facility]  Handouts given for hemorrhoids, hemorrhoid banding, diverticulosis and polyps.   YOU HAD AN ENDOSCOPIC PROCEDURE TODAY AT [facility]:   Refer to the procedure report that was given to you for any specific questions about what was found during the examination.  If the procedure report does not answer your questions, please call your gastroenterologist to clarify.  If you requested that your care partner not be given the details of your procedure findings, then the procedure report has been included in a sealed envelope for you to review at your convenience later.  YOU SHOULD EXPECT: Some feelings of bloating in the abdomen. Passage of more gas than usual.  Walking can help get rid of the air that was put into your GI tract during the procedure and reduce the bloating. If you had a lower endoscopy (such as a colonoscopy or flexible sigmoidoscopy) you may notice spotting of blood in your stool or on the toilet paper. If you underwent a bowel prep for your procedure, you may not have a normal bowel movement for a few days.  Please Note:  You might notice some irritation and congestion in your nose or some drainage.  This is from the oxygen used during your procedure.  There is no need for concern and it should clear up in a day or so.  SYMPTOMS TO REPORT IMMEDIATELY:  Following lower endoscopy (colonoscopy or flexible sigmoidoscopy):  Excessive amounts of blood in the stool  Significant tenderness  or worsening of abdominal pains  Swelling of the abdomen that is new, acute  Fever of 100F or higher  For urgent or emergent issues, a gastroenterologist can be reached at any hour by calling [phone]. Do not use MyChart messaging for urgent concerns.    DIET:  We do recommend a small meal at first, but then you may proceed to your regular diet.  Drink plenty of fluids but you should avoid alcoholic beverages for 24 hours.  ACTIVITY:  You should plan to take it easy for the rest of today and you should NOT DRIVE or use heavy machinery until tomorrow (because of the sedation medicines used during the test).    FOLLOW UP: Our staff will call the number listed on your records 48-72 hours following your procedure to check on you and address any questions or concerns that you may have regarding the information given to you following your procedure. If we do not reach you, we will leave a message.  We will attempt to reach you two times.  During this call, we will ask if you have developed any symptoms of COVID 19. If you develop any symptoms (ie: fever, flu-like symptoms, shortness of breath, cough etc.) before then, please call [phone].  If you test positive for Covid 19 in the 2 weeks post procedure, please call and report this information to [place].    If any biopsies were taken you will be contacted by phone or by letter within the next  1-3 weeks.  Please call us at [phone] if you have not heard about the biopsies in 3 weeks.    SIGNATURES/CONFIDENTIALITY: You and/or your care partner have signed paperwork which will be entered into your electronic medical record.  These signatures attest to the fact that that the information above on your After Visit Summary has been reviewed and is understood.  Full responsibility of the confidentiality of this discharge information lies with you and/or your care-partner.

## 2021-02-11 NOTE — Progress Notes (Signed)
History and Physical Interval Note:  02/11/2021 8:18 AM  Kenneth Shaw  has presented today for endoscopic procedure(s), with the diagnosis of  Encounter Diagnosis  Name Primary?   Rectal bleeding Yes  .  The various methods of evaluation and treatment have been discussed with the patient and/or family. After consideration of risks, benefits and other options for treatment, the patient has consented to  the endoscopic procedure(s).   The patient's history has been reviewed, patient examined, no change in status, stable for endoscopic procedure(s).  I have reviewed the patient's chart and labs.  Questions were answered to the patient's satisfaction.     Gatha Mayer, MD, [facility]

## 2021-02-11 NOTE — Op Note (Signed)
[facility] Patient Name: Kenneth Shaw Procedure Date: 02/11/2021 8:21 AM MRN: [id number] Endoscopist: Gatha Mayer , MD Age: 65 Referring MD:  Date of Birth: [birth year] Gender: Male Account #: [id number] Procedure:                Colonoscopy Indications:              Rectal bleeding Medicines:                Propofol per Anesthesia, Monitored Anesthesia Care Procedure:                Pre-Anesthesia Assessment:                           - Prior to the procedure, a History and Physical                            was performed, and patient medications and                            allergies were reviewed. The patient's tolerance of                            previous anesthesia was also reviewed. The risks                            and benefits of the procedure and the sedation                            options and risks were discussed with the patient.                            All questions were answered, and informed consent                            was obtained. Prior Anticoagulants: The patient has                            taken no previous anticoagulant or antiplatelet                            agents. ASA Grade Assessment: II - A patient with                            mild systemic disease. After reviewing the risks                            and benefits, the patient was deemed in                            satisfactory condition to undergo the procedure.                           After obtaining informed consent, the colonoscope  was passed under direct vision. Throughout the                            procedure, the patient's blood pressure, pulse, and                            oxygen saturations were monitored continuously. The                            CF [id number] was introduced through the anus                            and advanced to the the cecum, identified by                            appendiceal orifice and  ileocecal valve. The                            colonoscopy was performed without difficulty. The                            patient tolerated the procedure well. The quality                            of the bowel preparation was adequate. The                            ileocecal valve, appendiceal orifice, and rectum                            were photographed. The bowel preparation used was                            Miralax via split dose instruction. Scope In: 8:29:05 AM Scope Out: [id number] AM Scope Withdrawal Time: 0 hours 12 minutes 6 seconds  Total Procedure Duration: 0 hours 17 minutes 11 seconds  Findings:                 The colon (entire examined portion) appeared normal.                           Three flat and sessile polyps were found in the                            transverse colon and cecum. The polyps were                            diminutive in size. These polyps were removed with                            a cold snare. Resection and retrieval were                            complete. Verification of patient identification  for the specimen was done. Estimated blood loss was                            minimal.                           Internal hemorrhoids were found.                           Multiple diverticula were found in the sigmoid                            colon and ascending colon.                           The exam was otherwise without abnormality on                            direct and retroflexion views.                           The digital rectal exam findings include surgically                            absent prostate. Complications:            No immediate complications. Estimated Blood Loss:     Estimated blood loss was minimal. Impression:               - The entire examined colon is normal.                           - Three diminutive polyps in the transverse colon                            and in the cecum,  removed with a cold snare.                            Resected and retrieved.                           - Internal hemorrhoids.                           - Diverticulosis in the sigmoid colon and in the                            ascending colon.                           - The examination was otherwise normal on direct                            and retroflexion views.                           - A surgically absent prostate found on digital  rectal exam. Recommendation:           - Repeat colonoscopy [day].                           - Patient has a contact number available for                            emergencies. The signs and symptoms of potential                            delayed complications were discussed with the                            patient. Return to normal activities tomorrow.                            Written discharge instructions were provided to the                            patient.                           - Resume previous diet.                           - Continue present medications.                           - Repeat colonoscopy is recommended. The                            colonoscopy date will be determined after pathology                            results from today's exam become available for                            review.                           - MY OFFICE WILL ARRANGE AN APPOINTMENT TO BAND                            HEMORRHOIDS Gatha Mayer, MD 02/11/2021 8:56:10 AM This report has been signed electronically.

## 2021-02-11 NOTE — Progress Notes (Signed)
Vitals-CW  History reviewed. 

## 2021-02-15 ENCOUNTER — Telehealth: Payer: Self-pay

## 2021-02-15 NOTE — Telephone Encounter (Signed)
  Follow up Call-  Call back number 02/11/2021  Post procedure Call Back phone  # [phone]  Permission to leave phone message Yes  Some recent data might be hidden     Patient questions:  Do you have a fever, pain , or abdominal swelling? No. Pain Score  0 *  Have you tolerated food without any problems? Yes.    Have you been able to return to your normal activities? Yes.    Do you have any questions about your discharge instructions: Diet   No. Medications  No. Follow up visit  No.  Do you have questions or concerns about your Care? No.  Actions: * If pain score is 4 or above: No action needed, pain <4.

## 2021-02-19 ENCOUNTER — Encounter: Payer: Self-pay | Admitting: Internal Medicine

## 2021-03-24 ENCOUNTER — Encounter: Payer: Self-pay | Admitting: Internal Medicine

## 2021-03-24 ENCOUNTER — Ambulatory Visit (INDEPENDENT_AMBULATORY_CARE_PROVIDER_SITE_OTHER): Payer: Medicare Other | Admitting: Internal Medicine

## 2021-03-24 VITALS — BP 134/68 | HR 78 | Ht 75.5 in | Wt 259.0 lb

## 2021-03-24 DIAGNOSIS — K642 Third degree hemorrhoids: Secondary | ICD-10-CM | POA: Diagnosis not present

## 2021-03-24 NOTE — Patient Instructions (Addendum)
HEMORRHOID BANDING PROCEDURE    FOLLOW-UP CARE   The procedure you have had should have been relatively painless since the banding of the area involved does not have nerve endings and there is no pain sensation.  The rubber band cuts off the blood supply to the hemorrhoid and the band may fall off as soon as 48 hours after the banding (the band may occasionally be seen in the toilet bowl following a bowel movement). You may notice a temporary feeling of fullness in the rectum which should respond adequately to plain Tylenol or Motrin.  Following the banding, avoid strenuous exercise that evening and resume full activity the next day.  A sitz bath (soaking in a warm tub) or bidet is soothing, and can be useful for cleansing the area after bowel movements.     To avoid constipation, take one tablespoons of natural wheat bran, natural oat bran, flax, Benefiber or any over the counter fiber supplement and increase your water intake to 7-8 glasses daily.    Unless you have been prescribed anorectal medication, do not put anything inside your rectum for two weeks: No suppositories, enemas, fingers, etc.  Occasionally, you may have more bleeding than usual after the banding procedure.  This is often from the untreated hemorrhoids rather than the treated one.  Don't be concerned if there is a tablespoon or so of blood.  If there is more blood than this, lie flat with your bottom higher than your head and apply an ice pack to the area. If the bleeding does not stop within a half an hour or if you feel faint, call our office at [phone] or go to the emergency room.  Problems are not common; however, if there is a substantial amount of bleeding, severe pain, chills, fever or difficulty passing urine (very rare) or other problems, you should call us at [phone] or report to the nearest emergency room.  Do not stay seated continuously for more than 2-3 hours for a day or two after the procedure.   Tighten your buttock muscles 10-15 times every two hours and take 10-15 deep breaths every 1-2 hours.  Do not spend more than a few minutes on the toilet if you cannot empty your bowel; instead re-visit the toilet at a later time.  I appreciate the opportunity to care for you. Silvano Rusk, MD, [facility]

## 2021-03-24 NOTE — Progress Notes (Signed)
  Hemorrhoid ligation procedure Bleeding hemorrhoids - colonoscopy 9/822 confirmed source  Rectal demonstrates a grade 3 prolapsed right posterior internal hemorrhoid  Anoscopy shows a grade 3 prolapsed right posterior internal hemorrhoid and grade 2 right anterior and left lateral with the left lateral being more prominent and inflamed  PROCEDURE NOTE: The patient presents with symptomatic grade 2-3  hemorrhoids, requesting rubber band ligation of his/her hemorrhoidal disease.  All risks, benefits and alternative forms of therapy were described and informed consent was obtained.   The anorectum was pre-medicated with 0.125%NTG and 5% lidocaine The decision was made to band all 3  internal hemorrhoids, and the [place] was used to perform band ligation without complication.  Digital anorectal examination was then performed to assure proper positioning of the band, and to adjust the banded tissue as required.  The patient was discharged home without pain or other issues.  Dietary and behavioral recommendations were given and along with follow-up instructions.     The following adjunctive treatments were recommended:  benefiber 1 tablespoon daily  The patient will return in 2 mos for  follow-up and possible additional banding as required. No complications were encountered and the patient tolerated the procedure well.

## 2021-05-18 ENCOUNTER — Ambulatory Visit (INDEPENDENT_AMBULATORY_CARE_PROVIDER_SITE_OTHER): Payer: Medicare Other | Admitting: Internal Medicine

## 2021-05-18 ENCOUNTER — Encounter: Payer: Self-pay | Admitting: Internal Medicine

## 2021-05-18 VITALS — BP 136/74 | HR 70 | Ht 75.0 in | Wt 263.0 lb

## 2021-05-18 DIAGNOSIS — K645 Perianal venous thrombosis: Secondary | ICD-10-CM

## 2021-05-18 DIAGNOSIS — K642 Third degree hemorrhoids: Secondary | ICD-10-CM | POA: Diagnosis not present

## 2021-05-18 NOTE — Patient Instructions (Signed)
Try to use warm soaks for your thrombosed hemorrhoid or purchase a sitz bath and use that.  Follow up with [place] in a month. If your better you may cancel.   I appreciate the opportunity to care for you. Silvano Rusk, MD, [facility]

## 2021-05-18 NOTE — Progress Notes (Signed)
Kenneth Shaw 65 y.o. 1955/08/08 [id number]  Assessment & Plan:   Encounter Diagnoses  Name Primary?   Thrombosed external hemorrhoid Yes   Prolapsed internal hemorrhoids, grade 3    Conservative treatment with heat to the thrombosed hemorrhoid.  Follow-up in a month or so to reassess.  No banding today.    Subjective:   Chief Complaint: Follow-up of hemorrhoids question repeat banding  HPI Patient had hemorrhoidal banding in October he had a grade 3 right posterior internal hemorrhoid and grade 2 and the other positions all were banded.  He did well but last week developed swelling and pain in the anal area.  This is improving.  No bleeding since the banding. Allergies  Allergen Reactions   Xarelto [Rivaroxaban] Rash   Current Meds  Medication Sig   Cholecalciferol (VITAMIN D3) 250 MCG (10000 UT) TABS Take 10,000 Units by mouth every evening.   Coenzyme Q10 (COQ10 PO) Take 1 capsule by mouth at bedtime.   ezetimibe (ZETIA) 10 MG tablet Take 10 mg by mouth daily.   levothyroxine (SYNTHROID) 112 MCG tablet take one a day   MAGNESIUM PO Take 1 tablet by mouth at bedtime.   mirabegron ER (MYRBETRIQ) 50 MG TB24 tablet Take 50 mg by mouth daily.   Multiple Vitamin (MULTIVITAMIN WITH MINERALS) TABS tablet Take 1 tablet by mouth at bedtime. Men's Multivitamin   NON FORMULARY BIPAP MACHINE   rosuvastatin (CRESTOR) 5 MG tablet Take 5 mg by mouth every Sunday.    VITAMIN E PO Take 2 capsules by mouth at bedtime.   Zinc 50 MG TABS Take 100 mg by mouth every evening.   [DISCONTINUED] EUTHYROX 100 MCG tablet Take 100 mcg by mouth daily before breakfast.    Past Medical History:  Diagnosis Date   ADD (attention deficit disorder)    no official testing   Anisocoria    chronic   Arthritis    HISTORY OF   Basal cell cancer    Dr Denna Haggard   Diverticulosis    Dupuytren's contracture of both hands    fracture as child   left arm no surgery   Goiter    SMALL   History of kidney  stones    History of sleep apnea    BiPAP   Hx of adenomatous colonic polyps 02/10/2021   2 diminutive, recall 7-10   Hyperlipidemia    Hypothyroidism    Nonspecific ST-T changes    early repolarization   Pre-diabetes    Prostate cancer ([place])    Sleep apnea    bipap 10.7 inhale 7 on exhale   Past Surgical History:  Procedure Laterality Date   COLONOSCOPY     Tics; [facility] x2   HERNIA REPAIR  8- 10 yrs ago   bilateral inguinal   KNEE ARTHROSCOPY W/ ACL RECONSTRUCTION  1980   Left Knee    KNEE SURGERY Left 2008   arthroscopy L knee, [facility]; Dr Tonita Cong   LYMPHADENECTOMY Bilateral 11/29/2019   Procedure: WITH INJECTION ICG DYE AND LYMPHADENECTOMY;  Surgeon: Alexis Frock, MD;  Location: [facility];  Service: Urology;  Laterality: Bilateral;   ROBOT ASSISTED LAPAROSCOPIC RADICAL PROSTATECTOMY N/A 11/29/2019   Procedure: XI ROBOTIC ASSISTED LAPAROSCOPIC RADICAL PROSTATECTOMY;  Surgeon: Alexis Frock, MD;  Location: [facility];  Service: Urology;  Laterality: N/A;  3 HRS   THRYOID BIOPSY     TONSILLECTOMY  as child   TOTAL KNEE ARTHROPLASTY  04/19/2012   Procedure: TOTAL KNEE ARTHROPLASTY;  Surgeon:  Gearlean Alf, MD;  Location: [facility];  Service: Orthopedics;  Laterality: Left;   VASECTOMY     Social History   Social History Narrative   GETS REG EXERCISE--SWIMS         family history includes Atrial fibrillation in his mother; Bipolar disorder in his sister; Bone cancer in his paternal grandmother and paternal uncle; Diabetes in his maternal grandmother; Heart attack in his maternal grandfather and maternal uncle; Hyperlipidemia in his mother; Hypertension in his mother; Ovarian cancer in his maternal aunt; Sleep apnea in his father; Stroke in his paternal grandfather; Thyroid disease in his mother.   Review of Systems As above  Objective:   Physical Exam BP 136/74    Pulse 70    Ht 6\' 3"  (1.905 m)    Wt 263 lb (119.3 kg)    BMI 32.87 kg/m     DRE - tender RP external  hemorrhoid - thrombosed - somewhat soft w/ underlying firmness DRE o/w negative

## 2021-06-06 HISTORY — PX: HEMORRHOID BANDING: SHX5850

## 2021-06-06 HISTORY — PX: COLONOSCOPY: SHX174

## 2021-06-25 ENCOUNTER — Ambulatory Visit (INDEPENDENT_AMBULATORY_CARE_PROVIDER_SITE_OTHER): Payer: Medicare Other | Admitting: Internal Medicine

## 2021-06-25 ENCOUNTER — Encounter: Payer: Self-pay | Admitting: Internal Medicine

## 2021-06-25 VITALS — BP 134/74 | HR 84 | Ht 75.0 in | Wt 259.0 lb

## 2021-06-25 DIAGNOSIS — K645 Perianal venous thrombosis: Secondary | ICD-10-CM | POA: Diagnosis not present

## 2021-06-25 DIAGNOSIS — K642 Third degree hemorrhoids: Secondary | ICD-10-CM | POA: Diagnosis not present

## 2021-06-25 NOTE — Assessment & Plan Note (Signed)
Right posterior and left lateral banded return in 6 to 8 weeks

## 2021-06-25 NOTE — Patient Instructions (Signed)

## 2021-06-25 NOTE — Progress Notes (Signed)
° °  The patient returns for follow-up and treatment of hemorrhoids.  He has had banding in October 2022 (all 3 columns) with a grade 3 right posterior internal hemorrhoid being the most severe.  In the interim he had a thrombosed external hemorrhoid in the right posterior area so I did not band when he was seen in December.  After the first banding his bleeding greatly improved and he feels like there is been progress.  However he is starting to have a little bit of bleeding again and he still has some prolapsing issues.  He is interested in further treatment.  Rectal - external RP and LL-posterior hemorrhoids seen  DRE o/w NL  Anoscopy w/ Gr 2 RP > LL internal hemorrhoids  PROCEDURE NOTE: The patient presents with symptomatic grade 2-3  hemorrhoids, requesting rubber band ligation of his/her hemorrhoidal disease.  All risks, benefits and alternative forms of therapy were described and informed consent was obtained.   The anorectum was pre-medicated with 0.125% NTG and lidocaine The decision was made to band the RP and LL internal hemorrhoid, and the [facility] was used to perform band ligation without complication.  Digital anorectal examination was then performed to assure proper positioning of the band, and to adjust the banded tissue as required.  The patient was discharged home without pain or other issues.  Dietary and behavioral recommendations were given and along with follow-up instructions.     The following adjunctive treatments were recommended:  benefiber  The patient will return in 6-8 weeks for  follow-up and possible additional banding as required. No complications were encountered and the patient tolerated the procedure well.

## 2021-08-11 ENCOUNTER — Ambulatory Visit (INDEPENDENT_AMBULATORY_CARE_PROVIDER_SITE_OTHER): Payer: Medicare Other | Admitting: Internal Medicine

## 2021-08-11 ENCOUNTER — Encounter: Payer: Self-pay | Admitting: Internal Medicine

## 2021-08-11 VITALS — BP 140/86 | HR 66 | Ht 75.0 in | Wt 251.2 lb

## 2021-08-11 DIAGNOSIS — K642 Third degree hemorrhoids: Secondary | ICD-10-CM | POA: Diagnosis not present

## 2021-08-11 NOTE — Progress Notes (Signed)
? Kenneth Shaw y.o. [birth year] [id number] ? ?Assessment & Plan:  ? ?Encounter Diagnosis  ?Name Primary?  ? Prolapsed internal hemorrhoids, grade 3 Yes  ? ? ?Continue with better toileting techniques as he is (less time on the toilet), eliminating straining and continuing fiber supplementation.  We can always reassess if symptoms flare and consider repeat banding.  Follow-up as needed. ?CC: [name], MD ? ? ? ? ?Subjective:  ? ?Chief Complaint: Hemorrhoids ? ?HPI ?Patient is a 66 year old man that presented with rectal bleeding and prolapsed internal hemorrhoids grade 3 found at colonoscopy and endoscopy procedures.  Right posterior grade 3 RA and LL grade 2.  He had all 3 banded in October 2022 in December he had a thrombosed external hemorrhoid and we deferred banding and he returned in January on the 20th and I banded what I thought were grade 2 RP and LL internal hemorrhoids.  He takes his Benefiber regularly and he reports that he really does well he is not having problems except an occasional protrusion with the external hemorrhoids.  No bleeding.  He is satisfied with the quality of life improvement. ?Allergies  ?Allergen Reactions  ? Xarelto [Rivaroxaban] Rash  ? ?Current Meds  ?Medication Sig  ? Cholecalciferol (VITAMIN D3) 250 MCG (10000 UT) TABS Take 10,000 Units by mouth every evening.  ? Coenzyme Q10 (COQ10 PO) Take 1 capsule by mouth at bedtime.  ? ezetimibe (ZETIA) 10 MG tablet Take 10 mg by mouth daily.  ? levothyroxine (SYNTHROID) 112 MCG tablet take one a day  ? MAGNESIUM PO Take 1 tablet by mouth at bedtime.  ? Multiple Vitamin (MULTIVITAMIN WITH MINERALS) TABS tablet Take 1 tablet by mouth at bedtime. Men's Multivitamin  ? NON FORMULARY BIPAP MACHINE  ? oxybutynin (DITROPAN-XL) 10 MG 24 hr tablet Take 10 mg by mouth daily.  ? rosuvastatin (CRESTOR) 5 MG tablet Take 5 mg by mouth every Sunday.   ? VITAMIN E PO Take 2 capsules by mouth at bedtime.  ? Zinc 50 MG TABS Take 100 mg by  mouth every evening.  ? ?Past Medical History:  ?Diagnosis Date  ? ADD (attention deficit disorder)   ? no official testing  ? Anisocoria   ? chronic  ? Arthritis   ? HISTORY OF  ? Basal cell cancer   ? Dr Denna Haggard  ? Diverticulosis   ? Dupuytren's contracture of both hands   ? fracture as child  ? left arm no surgery  ? Goiter   ? SMALL  ? History of kidney stones   ? History of sleep apnea   ? BiPAP  ? Hx of adenomatous colonic polyps 02/10/2021  ? 2 diminutive, recall 7-10  ? Hyperlipidemia   ? Hypothyroidism   ? Internal hemorrhoids   ? Nonspecific ST-T changes   ? early repolarization  ? Pre-diabetes   ? Prostate cancer ([place])   ? Sleep apnea   ? bipap 10.7 inhale 7 on exhale  ? ?Past Surgical History:  ?Procedure Laterality Date  ? COLONOSCOPY    ? Tics; [facility] x2  ? HEMORRHOID BANDING    ? HERNIA REPAIR  8- 10 yrs ago  ? bilateral inguinal  ? KNEE ARTHROSCOPY W/ ACL RECONSTRUCTION  1980  ? Left Knee   ? KNEE SURGERY Left 2008  ? arthroscopy L knee, [facility]; Dr Tonita Cong  ? LYMPHADENECTOMY Bilateral 11/29/2019  ? Procedure: WITH INJECTION ICG DYE AND LYMPHADENECTOMY;  Surgeon: Alexis Frock, MD;  Location: [facility];  Service: Urology;  Laterality: Bilateral;  ? ROBOT ASSISTED LAPAROSCOPIC RADICAL PROSTATECTOMY N/A 11/29/2019  ? Procedure: XI ROBOTIC ASSISTED LAPAROSCOPIC RADICAL PROSTATECTOMY;  Surgeon: Alexis Frock, MD;  Location: [facility];  Service: Urology;  Laterality: N/A;  3 HRS  ? THRYOID BIOPSY    ? TONSILLECTOMY  as child  ? TOTAL KNEE ARTHROPLASTY  04/19/2012  ? Procedure: TOTAL KNEE ARTHROPLASTY;  Surgeon: Gearlean Alf, MD;  Location: [facility];  Service: Orthopedics;  Laterality: Left;  ? VASECTOMY    ? ?Social History  ? ?Social History Narrative  ? GETS REG EXERCISE--SWIMS  ?   ?   ? ?family history includes Atrial fibrillation in his mother; Bipolar disorder in his sister; Bone cancer in his paternal grandmother and paternal uncle; Diabetes in his maternal grandmother; Heart attack in his  maternal grandfather and maternal uncle; Hyperlipidemia in his mother; Hypertension in his mother; Ovarian cancer in his maternal aunt; Sleep apnea in his father; Stroke in his paternal grandfather; Thyroid disease in his mother. ? ? ?Review of Systems ?As per HPI ? ?Objective:  ? Physical Exam ?BP 140/86   Pulse 66   Ht '6\' 3"'$  (1.905 m)   Wt 251 lb 3.2 oz (113.9 kg)   SpO2 95%   BMI 31.40 kg/m?  ? ? ?Inspection of the rectal area shows 2 slightly inflamed right anterior/right posterior external hemorrhoid columns.  Digital exam was not performed. ? ?

## 2021-08-11 NOTE — Patient Instructions (Signed)
If you are age 67 or older, your body mass index should be between 23-30. Your Body mass index is 31.4 kg/m?Marland Kitchen If this is out of the aforementioned range listed, please consider follow up with your Primary Care Provider. ? ?If you are age 65 or younger, your body mass index should be between 19-25. Your Body mass index is 31.4 kg/m?Marland Kitchen If this is out of the aformentioned range listed, please consider follow up with your Primary Care Provider.  ? ?________________________________________________________ ? ?The [facility] providers would like to encourage you to use [facility] to communicate with providers for non-urgent requests or questions.  Due to long hold times on the telephone, sending your provider a message by [facility] may be a faster and more efficient way to get a response.  Please allow 48 business hours for a response.  Please remember that this is for non-urgent requests.  ?_______________________________________________________ ? ?Please follow up as needed. ? ?It was a pleasure to see you today! ? ?Thank you for trusting me with your gastrointestinal care!   ? Dr. Silvano Rusk ? ? ?

## 2021-12-28 ENCOUNTER — Other Ambulatory Visit: Payer: Self-pay | Admitting: Endocrinology

## 2021-12-28 DIAGNOSIS — H93A1 Pulsatile tinnitus, right ear: Secondary | ICD-10-CM

## 2021-12-30 ENCOUNTER — Ambulatory Visit
Admission: RE | Admit: 2021-12-30 | Discharge: 2021-12-30 | Disposition: A | Discharge status: Home / Self Care | Payer: Medicare Other | Source: Ambulatory Visit | Attending: Endocrinology | Admitting: Endocrinology

## 2021-12-30 DIAGNOSIS — H93A1 Pulsatile tinnitus, right ear: Secondary | ICD-10-CM

## 2022-01-31 ENCOUNTER — Encounter (HOSPITAL_BASED_OUTPATIENT_CLINIC_OR_DEPARTMENT_OTHER): Payer: Self-pay | Admitting: Orthopedic Surgery

## 2022-01-31 ENCOUNTER — Other Ambulatory Visit: Payer: Self-pay

## 2022-01-31 NOTE — Progress Notes (Addendum)
Spoke w/ via phone for pre-op interview---pt Lab needs dos----  none             Lab results------ COVID test -----patient states asymptomatic no test needed Arrive at -------945 am 02-08-2022 NPO after MN NO Solid Food.  Clear liquids from MN until---845 am Med rec completed Medications to take morning of surgery -----ezetimibe, levothyroxine, oxybutynin, indapamide Diabetic medication -----n/a Patient instructed no nail polish to be worn day of surgery Patient instructed to bring photo id and insurance card day of surgery Patient aware to have Driver (ride ) / caregiver   Kenneth Shaw  for 24 hours after surgery  Patient Special Instructions -----bring cpap mask tubing and machine and leave in car Pre-Op special Istructions -----none Patient verbalized understanding of instructions that were given at this phone interview. Patient denies shortness of breath, chest pain, fever, cough at this phone interview.

## 2022-02-04 NOTE — H&P (Signed)
Preoperative History & Physical Exam  Surgeon: Matt Holmes, MD  Diagnosis: Right small finger dupuytrens contracture  Planned Procedure: Procedure(s) (LRB): Right small finger dupuytrens partial palmar fasciectomy, proximal interphalangeal joint volar plate/capsular release (Right)  History of Present Illness:   [name] with symptoms consistent with Right small finger dupuytrens contracture who presents for surgical intervention. The risks, benefits and alternatives of surgical intervention were discussed and informed consent was obtained prior to surgery.  Past Medical History:  Past Medical History:  Diagnosis Date   ADD (attention deficit disorder)    no official testing   Anisocoria    chronic pupil size difference   Arthritis    Basal cell cancer    Dr Denna Haggard 1 spot on chest yrs ago   Diverticulosis    Dupuytren's contracture of both hands    fracture as child   left arm no surgery   Goiter    small  , checked by primary md dr Roque Cash [facility] at office visits q 4 months, last US done 03-23-2012 epic   History of COVID-19 04/2020   mild all symptoms resolved   History of kidney stones    Hx of adenomatous colonic polyps 02/10/2021   2 diminutive, recall 7-10   Hyperlipidemia    Hypothyroidism    Internal hemorrhoids    Pre-diabetes    Prostate cancer ([place]) 2021   Sleep apnea    cpap autoset   Wears contact lenses     Past Surgical History:  Past Surgical History:  Procedure Laterality Date   COLONOSCOPY  2023   Tics; [facility] x2   HEMORRHOID BANDING  2023   HERNIA REPAIR     bilateral inguinal  25 yrs ago per pt on 01-31-2022   KNEE ARTHROSCOPY W/ ACL RECONSTRUCTION  1980   Left Knee    KNEE SURGERY Left 2008   arthroscopy L knee, [facility]; Dr Tonita Cong   LYMPHADENECTOMY Bilateral 11/29/2019   Procedure: WITH INJECTION ICG DYE AND LYMPHADENECTOMY;  Surgeon: Alexis Frock, MD;  [facility];  Service: Urology;   Laterality: Bilateral;   right forehead basal cell carcinoma removed  11/19/2020   ROBOT ASSISTED LAPAROSCOPIC RADICAL PROSTATECTOMY N/A 11/29/2019   Procedure: XI ROBOTIC ASSISTED LAPAROSCOPIC RADICAL PROSTATECTOMY;  Surgeon: Alexis Frock, MD;  [facility];  Service: Urology;  Laterality: N/A;  3 HRS   THRYOID BIOPSY     yrs ago   TONSILLECTOMY  as child   TOTAL KNEE ARTHROPLASTY  04/19/2012   Procedure: TOTAL KNEE ARTHROPLASTY;  Surgeon: Gearlean Alf, MD;  [facility]: [facility];  Service: Orthopedics;  Laterality: Left;   VASECTOMY     late 1980's or early 1990's    Medications:  Prior to Admission medications   Medication Sig Start Date End Date Taking? Authorizing Provider  Berberine Chloride (BERBERINE HCI PO) Take by mouth. 1000 mg bid   Yes [provider]  indapamide (LOZOL) 1.25 MG tablet Take 1.25 mg by mouth daily.   Yes [provider]  OVER THE COUNTER MEDICATION Vitamin k 2 100 mcg daily   Yes [provider]  Cholecalciferol (VITAMIN D3) 250 MCG (10000 UT) TABS Take 10,000 Units by mouth every evening.    [provider]  Coenzyme Q10 (COQ10 PO) Take 1 capsule by mouth at bedtime.    [provider]  ezetimibe (ZETIA) 10 MG tablet Take 10 mg by mouth daily. 08/02/19   [provider]  levothyroxine (SYNTHROID) 112 MCG tablet take one a day 06/22/18   [provider]  MAGNESIUM PO Take 1 tablet by mouth at bedtime.    [provider]  Multiple Vitamin (MULTIVITAMIN WITH MINERALS) TABS tablet Take 1 tablet by mouth at bedtime. Men's Multivitamin    [provider]  oxybutynin (DITROPAN-XL) 10 MG 24 hr tablet Take 10 mg by mouth daily. 08/04/21   [provider]  rosuvastatin (CRESTOR) 5 MG tablet Take 5 mg by mouth every Sunday.  07/22/19   [provider]  VITAMIN E PO Take 2 capsules by mouth at bedtime.    [provider]  Zinc 50 MG TABS Take 100 mg by mouth every  evening.    [provider]    Allergies:  Xarelto [rivaroxaban]  Review of Systems: Negative except per HPI.  Physical Exam: Alert and oriented, NAD Head and neck: no masses, normal alignment CV: pulse intact Pulm: no increased work of breathing, respirations even and unlabored Abdomen: non-distended Extremities: extremities warm and well perfused  LABS: No results found for this or any previous visit (from the past 2160 hour(s)).   Complete History and Physical exam available in the office notes  Kenneth Shaw

## 2022-02-08 ENCOUNTER — Ambulatory Visit (HOSPITAL_BASED_OUTPATIENT_CLINIC_OR_DEPARTMENT_OTHER)
Admission: RE | Admit: 2022-02-08 | Discharge: 2022-02-08 | Disposition: A | Discharge status: Home / Self Care | Payer: Medicare Other | Source: Ambulatory Visit | Attending: Orthopedic Surgery | Admitting: Orthopedic Surgery

## 2022-02-08 ENCOUNTER — Ambulatory Visit (HOSPITAL_BASED_OUTPATIENT_CLINIC_OR_DEPARTMENT_OTHER): Payer: Medicare Other | Admitting: Anesthesiology

## 2022-02-08 ENCOUNTER — Other Ambulatory Visit: Payer: Self-pay

## 2022-02-08 ENCOUNTER — Encounter (HOSPITAL_BASED_OUTPATIENT_CLINIC_OR_DEPARTMENT_OTHER): Payer: Self-pay | Admitting: Orthopedic Surgery

## 2022-02-08 ENCOUNTER — Encounter (HOSPITAL_BASED_OUTPATIENT_CLINIC_OR_DEPARTMENT_OTHER)
Admission: RE | Disposition: A | Discharge status: Home / Self Care | Payer: Self-pay | Source: Ambulatory Visit | Attending: Orthopedic Surgery

## 2022-02-08 DIAGNOSIS — Z87891 Personal history of nicotine dependence: Secondary | ICD-10-CM | POA: Insufficient documentation

## 2022-02-08 DIAGNOSIS — M720 Palmar fascial fibromatosis [Dupuytren]: Secondary | ICD-10-CM | POA: Insufficient documentation

## 2022-02-08 DIAGNOSIS — E039 Hypothyroidism, unspecified: Secondary | ICD-10-CM | POA: Insufficient documentation

## 2022-02-08 DIAGNOSIS — G4730 Sleep apnea, unspecified: Secondary | ICD-10-CM | POA: Insufficient documentation

## 2022-02-08 DIAGNOSIS — Z01818 Encounter for other preprocedural examination: Secondary | ICD-10-CM

## 2022-02-08 HISTORY — DX: Presence of spectacles and contact lenses: Z97.3

## 2022-02-08 HISTORY — PX: DUPUYTREN CONTRACTURE RELEASE: SHX1478

## 2022-02-08 SURGERY — RELEASE, DUPUYTREN CONTRACTURE
Anesthesia: Regional | Site: Hand | Laterality: Right

## 2022-02-08 MED ORDER — MIDAZOLAM HCL 2 MG/2ML IJ SOLN
2.0000 mg | Freq: Once | INTRAMUSCULAR | Status: AC
  Administered 2022-02-08: 2 mg via INTRAVENOUS

## 2022-02-08 MED ORDER — LIDOCAINE HCL (CARDIAC) PF 100 MG/5ML IV SOSY
PREFILLED_SYRINGE | INTRAVENOUS | Status: DC | PRN
  Administered 2022-02-08: 40 mg via INTRAVENOUS

## 2022-02-08 MED ORDER — OXYCODONE HCL 5 MG PO TABS: 5.0000 mg | ORAL_TABLET | Freq: Once | ORAL | Status: DC | PRN

## 2022-02-08 MED ORDER — LACTATED RINGERS IV SOLN: INTRAVENOUS | Status: DC

## 2022-02-08 MED ORDER — FENTANYL CITRATE (PF) 100 MCG/2ML IJ SOLN
50.0000 ug | Freq: Once | INTRAMUSCULAR | Status: AC
  Administered 2022-02-08: 50 ug via INTRAVENOUS

## 2022-02-08 MED ORDER — ACETAMINOPHEN 325 MG PO TABS: 325.0000 mg | ORAL_TABLET | ORAL | Status: DC | PRN

## 2022-02-08 MED ORDER — BUPIVACAINE-EPINEPHRINE (PF) 0.5% -1:200000 IJ SOLN
INTRAMUSCULAR | Status: DC | PRN
  Administered 2022-02-08: 20 mL via PERINEURAL

## 2022-02-08 MED ORDER — PROPOFOL 500 MG/50ML IV EMUL
INTRAVENOUS | Status: DC | PRN
  Administered 2022-02-08: 75 ug/kg/min via INTRAVENOUS

## 2022-02-08 MED ORDER — ONDANSETRON HCL 4 MG/2ML IJ SOLN
INTRAMUSCULAR | Status: DC | PRN
  Administered 2022-02-08: 4 mg via INTRAVENOUS

## 2022-02-08 MED ORDER — ACETAMINOPHEN 10 MG/ML IV SOLN: 1000.0000 mg | Freq: Once | INTRAVENOUS | Status: DC | PRN

## 2022-02-08 MED ORDER — PROPOFOL 10 MG/ML IV BOLUS
INTRAVENOUS | Status: DC | PRN
  Administered 2022-02-08: 20 mg via INTRAVENOUS
  Administered 2022-02-08: 200 mg via INTRAVENOUS
  Administered 2022-02-08: 30 mg via INTRAVENOUS

## 2022-02-08 MED ORDER — 0.9 % SODIUM CHLORIDE (POUR BTL) OPTIME
TOPICAL | Status: DC | PRN
  Administered 2022-02-08: 500 mL

## 2022-02-08 MED ORDER — PROPOFOL 10 MG/ML IV BOLUS
INTRAVENOUS | Status: AC
  Filled 2022-02-08: qty 20

## 2022-02-08 MED ORDER — ONDANSETRON HCL 4 MG/2ML IJ SOLN
INTRAMUSCULAR | Status: AC
  Filled 2022-02-08: qty 2

## 2022-02-08 MED ORDER — FENTANYL CITRATE (PF) 100 MCG/2ML IJ SOLN
INTRAMUSCULAR | Status: AC
  Filled 2022-02-08: qty 2

## 2022-02-08 MED ORDER — CEFAZOLIN SODIUM-DEXTROSE 2-4 GM/100ML-% IV SOLN
2.0000 g | INTRAVENOUS | Status: AC
  Administered 2022-02-08: 2 g via INTRAVENOUS

## 2022-02-08 MED ORDER — AMISULPRIDE (ANTIEMETIC) 5 MG/2ML IV SOLN: 10.0000 mg | Freq: Once | INTRAVENOUS | Status: DC | PRN

## 2022-02-08 MED ORDER — PROMETHAZINE HCL 25 MG/ML IJ SOLN: 6.2500 mg | INTRAMUSCULAR | Status: DC | PRN

## 2022-02-08 MED ORDER — OXYCODONE HCL 5 MG/5ML PO SOLN: 5.0000 mg | Freq: Once | ORAL | Status: DC | PRN

## 2022-02-08 MED ORDER — ACETAMINOPHEN 160 MG/5ML PO SOLN: 325.0000 mg | ORAL | Status: DC | PRN

## 2022-02-08 MED ORDER — FENTANYL CITRATE (PF) 100 MCG/2ML IJ SOLN
INTRAMUSCULAR | Status: DC | PRN
Start: 2022-02-08 — End: 2022-02-08
  Administered 2022-02-08 (×3): 25 ug via INTRAVENOUS

## 2022-02-08 MED ORDER — OXYCODONE-ACETAMINOPHEN 5-325 MG PO TABS: 1.0000 | ORAL_TABLET | Freq: Four times a day (QID) | ORAL | 0 refills | Status: AC | PRN

## 2022-02-08 MED ORDER — MIDAZOLAM HCL 2 MG/2ML IJ SOLN
INTRAMUSCULAR | Status: AC
  Filled 2022-02-08: qty 2

## 2022-02-08 MED ORDER — CEFAZOLIN SODIUM-DEXTROSE 2-4 GM/100ML-% IV SOLN
INTRAVENOUS | Status: AC
  Filled 2022-02-08: qty 100

## 2022-02-08 MED ORDER — BACITRACIN ZINC 500 UNIT/GM EX OINT
TOPICAL_OINTMENT | CUTANEOUS | Status: DC | PRN
  Administered 2022-02-08: 1 via TOPICAL

## 2022-02-08 MED ORDER — FENTANYL CITRATE (PF) 100 MCG/2ML IJ SOLN: 25.0000 ug | INTRAMUSCULAR | Status: DC | PRN

## 2022-02-08 MED ORDER — PROPOFOL 1000 MG/100ML IV EMUL
INTRAVENOUS | Status: AC
  Filled 2022-02-08: qty 100

## 2022-02-08 SURGICAL SUPPLY — 29 items
BLADE SURG 15 STRL LF DISP TIS (BLADE) ×1 IMPLANT
BLADE SURG 15 STRL SS (BLADE) ×1
BNDG CMPR 9X4 STRL LF SNTH (GAUZE/BANDAGES/DRESSINGS) ×1
BNDG ELASTIC 4X5.8 VLCR STR LF (GAUZE/BANDAGES/DRESSINGS) ×1 IMPLANT
BNDG ESMARK 4X9 LF (GAUZE/BANDAGES/DRESSINGS) ×1 IMPLANT
COVER BACK TABLE 60X90IN (DRAPES) ×1 IMPLANT
CUFF TOURN SGL QUICK 18X4 (TOURNIQUET CUFF) IMPLANT
DRAPE EXTREMITY T 121X128X90 (DISPOSABLE) ×1 IMPLANT
DRAPE SHEET LG 3/4 BI-LAMINATE (DRAPES) ×1 IMPLANT
DRAPE SURG 17X23 STRL (DRAPES) ×1 IMPLANT
DRSG EMULSION OIL 3X3 NADH (GAUZE/BANDAGES/DRESSINGS) ×1 IMPLANT
GAUZE 4X4 16PLY ~~LOC~~+RFID DBL (SPONGE) ×1 IMPLANT
GAUZE SPONGE 4X4 12PLY STRL (GAUZE/BANDAGES/DRESSINGS) ×1 IMPLANT
GLOVE BIO SURGEON STRL SZ 6.5 (GLOVE) IMPLANT
GLOVE BIOGEL PI IND STRL 7.5 (GLOVE) ×1 IMPLANT
GOWN STRL REUS W/TWL LRG LVL3 (GOWN DISPOSABLE) ×1 IMPLANT
HIBICLENS CHG 4% 4OZ BTL (MISCELLANEOUS) ×1 IMPLANT
NEEDLE HYPO 22GX1.5 SAFETY (NEEDLE) ×1 IMPLANT
NS IRRIG 500ML POUR BTL (IV SOLUTION) IMPLANT
PACK BASIN DAY SURGERY FS (CUSTOM PROCEDURE TRAY) ×1 IMPLANT
PAD CAST 4YDX4 CTTN HI CHSV (CAST SUPPLIES) ×1 IMPLANT
PADDING CAST COTTON 4X4 STRL (CAST SUPPLIES) ×1
SUT CHROMIC 4 0 PS 2 18 (SUTURE) IMPLANT
SUT ETHILON 4 0 PS 2 18 (SUTURE) IMPLANT
SYR 10ML LL (SYRINGE) ×1 IMPLANT
SYR BULB EAR ULCER 3OZ GRN STR (SYRINGE) ×1 IMPLANT
TOWEL OR 17X26 10 PK STRL BLUE (TOWEL DISPOSABLE) ×1 IMPLANT
TRAY DSU PREP LF (CUSTOM PROCEDURE TRAY) ×1 IMPLANT
UNDERPAD 30X36 HEAVY ABSORB (UNDERPADS AND DIAPERS) ×1 IMPLANT

## 2022-02-08 NOTE — Op Note (Signed)
OPERATIVE NOTE  DATE OF PROCEDURE: 02/08/2022  SURGEONS:  Primary: [name], MD  PREOPERATIVE DIAGNOSIS: RIGHT SMALL FINGER DUPUYTRENS CONTRACTURE  POSTOPERATIVE DIAGNOSIS: Same  NAME OF PROCEDURE:   Right small finger and palm dupuytrens partial palmar fasciectomy  ANESTHESIA: Regional  SKIN PREPARATION: Hibiclens  ESTIMATED BLOOD LOSS: Minimal  IMPLANTS: none  INDICATIONS:  Kenneth Shaw is a 66 y.o. male who has the above preoperative diagnosis. The patient has decided to proceed with surgical intervention.  Risks, benefits and alternatives of operative management were discussed including, but not limited to, risks of anesthesia complications, infection, pain, persistent symptoms, stiffness, need for future surgery.  The patient understands, agrees and elects to proceed with surgery.    DESCRIPTION OF PROCEDURE: The patient was met in the pre-operative area and their identity was verified.  The operative location and laterality was also verified and marked.  The patient was brought to the OR and was placed supine on the table.  After repeat patient identification with the operative team anesthesia was provided and the patient was prepped and draped in the usual sterile fashion.  A final timeout was performed verifying the correction patient, procedure, location and laterality.  The right small finger and right hand was elevated exsanguinated with an Esmarch and tourniquet inflated to 250 mmHg.  A Bruner incision was made across the right small finger and into the palm.  Skin and subcutaneous tissues were divided and the dermis was lifted from the underlying Dupuytren's cords.  There was a large pretendinous cord and spiral cord as well as an abductor cord along the ulnar border.  The neurovascular bundles were identified from the very proximal aspect of the incision all the way to the tip of the finger.  These were mobilized and protected and the Dupuytren's cords were all excised and sent a  specimen to pathology.  The finger was brought through full range of motion in the MCP and PIP joints were passively corrected to full extension.  No volar plate release was needed in order to obtain full extension of the PIP joint.  The wound was thoroughly irrigated and closed with 4-0 chromic suture in interrupted fashion.  The tourniquet was deflated and the finger was pink and warm and well-perfused with brisk cap refill to the tip of the finger.  There is excellent perfusion to the digit at the end of the procedure.  A sterile soft bandage was applied.  The patient was awoken from anesthesia and brought to PACU for recovery in stable condition.  All counts were correct x2.  Postoperative plan: The patient will go to hand therapy later this week for a dressing change nighttime extension splint and begin a range of motion exercise program.  Patient will likely need serial casting in order to obtain extension of the right small finger PIP joint, full passive range of motion obtained today.   Kenneth Holmes, MD

## 2022-02-08 NOTE — Anesthesia Procedure Notes (Signed)
Anesthesia Regional Block: Supraclavicular block   Pre-Anesthetic Checklist: , timeout performed,  Correct Patient, Correct Site, Correct Laterality,  Correct Procedure, Correct Position, site marked,  Risks and benefits discussed,  Surgical consent,  Pre-op evaluation,  At surgeon's request and post-op pain management  Laterality: Right  Prep: chloraprep       Needles:  Injection technique: Single-shot  Needle Type: Echogenic Stimulator Needle     Needle Length: 9cm  Needle Gauge: 21     Additional Needles:   Procedures:,,,, ultrasound used (permanent image in chart),,    Narrative:  Start time: 02/08/2022 10:10 AM End time: 02/08/2022 10:15 AM Injection made incrementally with aspirations every 5 mL.  Performed by: Personally  Anesthesiologist: Effie Berkshire, MD  Additional Notes: Patient tolerated the procedure well. Local anesthetic introduced in an incremental fashion under minimal resistance after negative aspirations. No paresthesias were elicited. After completion of the procedure, no acute issues were identified and patient continued to be monitored by RN.

## 2022-02-08 NOTE — Anesthesia Postprocedure Evaluation (Signed)
Anesthesia Post Note  Patient: Kenneth Shaw  Procedure(s) Performed: Right small finger dupuytrens partial palmar fasciectomy, proximal interphalangeal joint volar plate/capsular release (Right: Hand)     Patient location during evaluation: PACU Anesthesia Type: Regional Level of consciousness: awake and alert Pain management: pain level controlled Vital Signs Assessment: post-procedure vital signs reviewed and stable Respiratory status: spontaneous breathing, nonlabored ventilation, respiratory function stable and patient connected to nasal cannula oxygen Cardiovascular status: stable and blood pressure returned to baseline Postop Assessment: no apparent nausea or vomiting Anesthetic complications: no   No notable events documented.  Last Vitals:  Vitals:   02/08/22 1230 02/08/22 1309  BP: 120/73 124/79  Pulse: (!) 54 (!) 54  Resp: 12 14  Temp:  (!) 36.4 C  SpO2: 99% 98%    Last Pain:  Vitals:   02/08/22 1309  TempSrc:   PainSc: [place] Ajani Schnieders

## 2022-02-08 NOTE — H&P (Deleted)
Preoperative History & Physical Exam  Surgeon: Matt Holmes, MD  Diagnosis: RIGHT SMALL FINGER DUPUYTRENS CONTRACTURE  Planned Procedure: Procedure(s) (LRB): Right small finger dupuytrens partial palmar fasciectomy, proximal interphalangeal joint volar plate/capsular release (Right)  History of Present Illness:   [name] with symptoms consistent with RIGHT [place] who presents for surgical intervention. The risks, benefits and alternatives of surgical intervention were discussed and informed consent was obtained prior to surgery.  Past Medical History:  Past Medical History:  Diagnosis Date   ADD (attention deficit disorder)    no official testing   Anisocoria    chronic pupil size difference   Arthritis    Basal cell cancer    Dr Denna Haggard 1 spot on chest yrs ago   Diverticulosis    Dupuytren's contracture of both hands    fracture as child   left arm no surgery   Goiter    small  , checked by primary md dr Roque Cash [facility] at office visits q 4 months, last US done 03-23-2012 epic   History of COVID-19 04/2020   mild all symptoms resolved   History of kidney stones    Hx of adenomatous colonic polyps 02/10/2021   2 diminutive, recall 7-10   Hyperlipidemia    Hypothyroidism    Internal hemorrhoids    Pre-diabetes    Prostate cancer ([place]) 2021   Sleep apnea    cpap autoset   Wears contact lenses     Past Surgical History:  Past Surgical History:  Procedure Laterality Date   COLONOSCOPY  2023   Tics; [facility] x2   HEMORRHOID BANDING  2023   HERNIA REPAIR     bilateral inguinal  25 yrs ago per pt on 01-31-2022   KNEE ARTHROSCOPY W/ ACL RECONSTRUCTION  1980   Left Knee    KNEE SURGERY Left 2008   arthroscopy L knee, [facility]; Dr Tonita Cong   LYMPHADENECTOMY Bilateral 11/29/2019   Procedure: WITH INJECTION ICG DYE AND LYMPHADENECTOMY;  Surgeon: Alexis Frock, MD;  [facility];  Service: Urology;   Laterality: Bilateral;   right forehead basal cell carcinoma removed  11/19/2020   ROBOT ASSISTED LAPAROSCOPIC RADICAL PROSTATECTOMY N/A 11/29/2019   Procedure: XI ROBOTIC ASSISTED LAPAROSCOPIC RADICAL PROSTATECTOMY;  Surgeon: Alexis Frock, MD;  [facility]: [facility];  Service: Urology;  Laterality: N/A;  3 HRS   THRYOID BIOPSY     yrs ago   TONSILLECTOMY  as child   TOTAL KNEE ARTHROPLASTY  04/19/2012   Procedure: TOTAL KNEE ARTHROPLASTY;  Surgeon: Gearlean Alf, MD;  [facility];  Service: Orthopedics;  Laterality: Left;   VASECTOMY     late 1980's or early 1990's    Medications:  Prior to Admission medications   Medication Sig Start Date End Date Taking? Authorizing Provider  Berberine Chloride (BERBERINE HCI PO) Take by mouth. 1000 mg bid   Yes [provider]  Cholecalciferol (VITAMIN D3) 250 MCG (10000 UT) TABS Take 10,000 Units by mouth every evening.   Yes [provider]  Coenzyme Q10 (COQ10 PO) Take 1 capsule by mouth at bedtime.   Yes [provider]  ezetimibe (ZETIA) 10 MG tablet Take 10 mg by mouth daily. 08/02/19  Yes [provider]  indapamide (LOZOL) 1.25 MG tablet Take 1.25 mg by mouth daily.   Yes [provider]  levothyroxine (SYNTHROID) 112 MCG tablet take one a day 06/22/18  Yes [provider]  MAGNESIUM PO Take 1 tablet by mouth at bedtime.   Yes [provider]  Multiple Vitamin (MULTIVITAMIN WITH MINERALS) TABS tablet Take 1 tablet by mouth at bedtime. Men's Multivitamin   Yes [provider]  OVER THE COUNTER MEDICATION Vitamin k 2 100 mcg daily   Yes [provider]  oxybutynin (DITROPAN-XL) 10 MG 24 hr tablet Take 10 mg by mouth daily. 08/04/21  Yes [provider]  oxyCODONE-acetaminophen (PERCOCET) 5-325 MG tablet Take 1 tablet by mouth every 6 (six) hours as needed for up to 5 days for severe pain. 02/08/22 02/13/22 Yes [name], MD  rosuvastatin (CRESTOR) 5 MG  tablet Take 5 mg by mouth every Sunday.  07/22/19  Yes [provider]  VITAMIN E PO Take 2 capsules by mouth at bedtime.   Yes [provider]  Zinc 50 MG TABS Take 100 mg by mouth every evening.   Yes [provider]    Allergies:  Xarelto [rivaroxaban]  Review of Systems: Negative except per HPI.  Physical Exam: Alert and oriented, NAD Head and neck: no masses, normal alignment CV: pulse intact Pulm: no increased work of breathing, respirations even and unlabored Abdomen: non-distended Extremities: extremities warm and well perfused  LABS: No results found for this or any previous visit (from the past 2160 hour(s)).   Complete History and Physical exam available in the office notes  [name]

## 2022-02-08 NOTE — Discharge Instructions (Addendum)
Orthopaedic Hand Surgery Discharge Instructions  WEIGHT BEARING STATUS: Non weight bearing on operative extremity  DRESSING CARE: Please keep your dressing/splint/cast clean and dry until your follow-up appointment. You may shower by placing a waterproof covering over your dressing/splint/cast. Contact your surgeon if your splint/cast gets wet. It will need to be changed to prevent skin breakdown.  PAIN CONTROL: First line medications for post operative pain control are Tylenol (acetaminophen) and Motrin (ibuprofen) if you are able to take these medications. If you have been prescribed a medication these can be taken as breakthrough pain medications. Please note that some narcotic pain medication has acetaminophen added and you should never consume more than 4,'000mg'$  of acetaminophen in 24-hour period. Please note that if you are given Toradol (ketorolac) you should not take similar medications such as ibuprofen or naproxen.  DISCHARGE MEDICATIONS: If you have been prescribed medication it was sent electronically to your pharmacy. No changes have been made to your home medications.  ICE/ELEVATION: Ice and elevate your injured extremity as needed. Avoid direct contact of ice with skin.   BANDAGE FEELS TOO TIGHT: If your bandage feels too tight, first make sure you are elevating your fingers as much as possible. The outer layer of the bandage can be unwrapped and reapplied more loosely. If no improvement, you may carefully cut the inner layer longitudinally until the pressure has resolved and then rewrap the outer layer. If you are not comfortable with these instructions, please call the office and the bandage can be changed for you.   FOLLOW UP: You will be called after surgery with an appointment date and time, however if you have not received a phone call within 3 days, please call during regular office hours at [phone] to schedule a post operative appointment.  Please Seek Medical Attention  if: Call MD for: pain or pressure in chest, jaw, arm, back, neck  Call MD for: temperature greater than 101 F for more than 24 hrs Call MD for: difficulty breathing Call MD for: incision redness, bleeding, drainage  Call MD for: palpitations or feeling that the heart is racing  Call MD for: increased swelling in arm, leg, ankle, or abdomen  Call MD for: lightheadedness, dizziness, fainting Call 911 or go to ER for any medical emergency if you are not able to get in touch with your doctor   J. Sable Feil, MD Orthopaedic Hand Surgeon [facility] Office number: [phone] [address] [place],  [address]  Regional Anesthesia Blocks  1. Numbness or the inability to move the "blocked" extremity may last from 3-48 hours after placement. The length of time depends on the medication injected and your individual response to the medication. If the numbness is not going away after 48 hours, call your surgeon.  2. The extremity that is blocked will need to be protected until the numbness is gone and the  Strength has returned. Because you cannot feel it, you will need to take extra care to avoid injury. Because it may be weak, you may have difficulty moving it or using it. You may not know what position it is in without looking at it while the block is in effect.  3. For blocks in the legs and feet, returning to weight bearing and walking needs to be done carefully. You will need to wait until the numbness is entirely gone and the strength has returned. You should be able to move your leg and foot normally before you try and bear weight or walk.  You will need someone to be with you when you first try to ensure you do not fall and possibly risk injury.  4. Bruising and tenderness at the needle site are common side effects and will resolve in a few days.  5. Persistent numbness or new problems with movement should be communicated to the surgeon or the [facility]  [phone] [place] [phone]).  Post Anesthesia Home Care Instructions  Activity: Get plenty of rest for the remainder of the day. A responsible individual must stay with you for 24 hours following the procedure.  For the next 24 hours, DO NOT: -Drive a car -Paediatric nurse -Drink alcoholic beverages -Take any medication unless instructed by your physician -Make any legal decisions or sign important papers.  Meals: Start with liquid foods such as gelatin or soup. Progress to regular foods as tolerated. Avoid greasy, spicy, heavy foods. If nausea and/or vomiting occur, drink only clear liquids until the nausea and/or vomiting subsides. Call your physician if vomiting continues.  Special Instructions/Symptoms: Your throat may feel dry or sore from the anesthesia or the breathing tube placed in your throat during surgery. If this causes discomfort, gargle with warm salt water. The discomfort should disappear within 24 hours.

## 2022-02-08 NOTE — Anesthesia Procedure Notes (Signed)
Procedure Name: MAC Date/Time: 02/08/2022 10:50 AM  Performed by: Mechele Claude, CRNAPre-anesthesia Checklist: Emergency Drugs available, Patient identified, Suction available and Patient being monitored Oxygen Delivery Method: Simple face mask Placement Confirmation: positive ETCO2, breath sounds checked- equal and bilateral and CO2 detector

## 2022-02-08 NOTE — H&P (Signed)
Preoperative History & Physical Exam  Surgeon: Kenneth Holmes, MD  Diagnosis: RIGHT SMALL FINGER DUPUYTRENS CONTRACTURE  Planned Procedure: Procedure(s) (LRB): Right small finger dupuytrens partial palmar fasciectomy, proximal interphalangeal joint volar plate/capsular release (Right)  History of Present Illness:   [name] with symptoms consistent with [name] who presents for surgical intervention. The risks, benefits and alternatives of surgical intervention were discussed and informed consent was obtained prior to surgery.  Past Shaw History:  Past Shaw History:  Diagnosis Date   ADD (attention deficit disorder)    no official testing   Anisocoria    chronic pupil size difference   Arthritis    Basal cell cancer    Dr Kenneth Shaw 1 spot on chest yrs ago   Diverticulosis    Dupuytren's contracture of both hands    fracture as child   left arm no surgery   Goiter    small  , checked by primary md dr Kenneth Shaw at office visits q 4 months, last US done 03-23-2012 epic   History of COVID-19 04/2020   mild all symptoms resolved   History of kidney stones    Hx of adenomatous colonic polyps 02/10/2021   2 diminutive, recall 7-10   Hyperlipidemia    Hypothyroidism    Internal hemorrhoids    Pre-diabetes    Prostate cancer ([place]) 2021   Sleep apnea    cpap autoset   Wears contact lenses     Past Surgical History:  Past Surgical History:  Procedure Laterality Date   COLONOSCOPY  2023   Tics; [facility] x2   HEMORRHOID BANDING  2023   HERNIA REPAIR     bilateral inguinal  25 yrs ago per pt on 01-31-2022   KNEE ARTHROSCOPY W/ ACL RECONSTRUCTION  1980   Left Knee    KNEE SURGERY Left 2008   arthroscopy L knee, [facility]; Dr Kenneth Shaw   LYMPHADENECTOMY Bilateral 11/29/2019   Procedure: WITH INJECTION ICG DYE AND LYMPHADENECTOMY;  Surgeon: Kenneth Frock, MD;  Location: [facility];  Service: Urology;   Laterality: Bilateral;   right forehead basal cell carcinoma removed  11/19/2020   ROBOT ASSISTED LAPAROSCOPIC RADICAL PROSTATECTOMY N/A 11/29/2019   Procedure: XI ROBOTIC ASSISTED LAPAROSCOPIC RADICAL PROSTATECTOMY;  Surgeon: Kenneth Frock, MD;  Location: [facility];  Service: Urology;  Laterality: N/A;  3 HRS   THRYOID BIOPSY     yrs ago   TONSILLECTOMY  as child   TOTAL KNEE ARTHROPLASTY  04/19/2012   Procedure: TOTAL KNEE ARTHROPLASTY;  Surgeon: Kenneth Alf, MD;  Location: [facility];  Service: Orthopedics;  Laterality: Left;   VASECTOMY     late 1980's or early 1990's    Medications:  Prior to Admission medications   Medication Sig Start Date End Date Taking? Authorizing Provider  Berberine Chloride (BERBERINE HCI PO) Take by mouth. 1000 mg bid   Yes [provider]  Cholecalciferol (VITAMIN D3) 250 MCG (10000 UT) TABS Take 10,000 Units by mouth every evening.   Yes [provider]  Coenzyme Q10 (COQ10 PO) Take 1 capsule by mouth at bedtime.   Yes [provider]  ezetimibe (ZETIA) 10 MG tablet Take 10 mg by mouth daily. 08/02/19  Yes [provider]  indapamide (LOZOL) 1.25 MG tablet Take 1.25 mg by mouth daily.   Yes [provider]  levothyroxine (SYNTHROID) 112 MCG tablet take one a day 06/22/18  Yes [provider]  MAGNESIUM PO Take 1 tablet by mouth at bedtime.   Yes [provider]  Multiple Vitamin (MULTIVITAMIN WITH MINERALS) TABS tablet Take 1 tablet by mouth at bedtime. Men's Multivitamin   Yes [provider]  OVER THE COUNTER MEDICATION Vitamin k 2 100 mcg daily   Yes [provider]  oxybutynin (DITROPAN-XL) 10 MG 24 hr tablet Take 10 mg by mouth daily. 08/04/21  Yes [provider]  oxyCODONE-acetaminophen (PERCOCET) 5-325 MG tablet Take 1 tablet by mouth every 6 (six) hours as needed for up to 5 days for severe pain. 02/08/22 02/13/22 Yes [name], MD  rosuvastatin (CRESTOR) 5 MG  tablet Take 5 mg by mouth every Sunday.  07/22/19  Yes [provider]  VITAMIN E PO Take 2 capsules by mouth at bedtime.   Yes [provider]  Zinc 50 MG TABS Take 100 mg by mouth every evening.   Yes [provider]    Allergies:  Xarelto [rivaroxaban]  Review of Systems: Negative except per HPI.  Physical Exam: Alert and oriented, NAD Head and neck: no masses, normal alignment CV: pulse intact Pulm: no increased work of breathing, respirations even and unlabored Abdomen: non-distended Extremities: extremities warm and well perfused  LABS: No results found for this or any previous visit (from the past 2160 hour(s)).   Complete History and Physical exam available in the office notes  [name]

## 2022-02-08 NOTE — Interval H&P Note (Signed)
History and Physical Interval Note:  02/08/2022 12:09 PM  Kenneth Shaw  has presented today for surgery, with the diagnosis of RIGHT SMALL FINGER DUPUYTRENS CONTRACTURE.  The various methods of treatment have been discussed with the patient and family. After consideration of risks, benefits and other options for treatment, the patient has consented to  Procedure(s) with comments: Right small finger dupuytrens partial palmar fasciectomy, proximal interphalangeal joint volar plate/capsular release (Right) - 43mn as a surgical intervention.  The patient's history has been reviewed, patient examined, no change in status, stable for surgery.  I have reviewed the patient's chart and labs.  Questions were answered to the patient's satisfaction.     [name]

## 2022-02-08 NOTE — Anesthesia Preprocedure Evaluation (Addendum)
Anesthesia Evaluation  Patient identified by MRN, date of birth, ID band Patient awake    Reviewed: Allergy & Precautions, NPO status , Patient's Chart, lab work & pertinent test results  Airway Mallampati: II  TM Distance: >3 FB Neck ROM: Full    Dental  (+) Teeth Intact, Dental Advisory Given   Pulmonary sleep apnea , former smoker,    breath sounds clear to auscultation       Cardiovascular negative cardio ROS   Rhythm:Regular Rate:Normal     Neuro/Psych PSYCHIATRIC DISORDERS    GI/Hepatic negative GI ROS, Neg liver ROS,   Endo/Other  Hypothyroidism   Renal/GU negative Renal ROS     Musculoskeletal  (+) Arthritis ,   Abdominal Normal abdominal exam  (+)   Peds  Hematology   Anesthesia Other Findings   Reproductive/Obstetrics                            Anesthesia Physical Anesthesia Plan  ASA: 2  Anesthesia Plan: Regional   Post-op Pain Management:    Induction: Intravenous  PONV Risk Score and Plan: 2 and Ondansetron, Propofol infusion and Midazolam  Airway Management Planned: Natural Airway and Simple Face Mask  Additional Equipment: None  Intra-op Plan:   Post-operative Plan:   Informed Consent: I have reviewed the patients History and Physical, chart, labs and discussed the procedure including the risks, benefits and alternatives for the proposed anesthesia with the patient or authorized representative who has indicated his/her understanding and acceptance.       Plan Discussed with: CRNA  Anesthesia Plan Comments:        Anesthesia Quick Evaluation

## 2022-02-08 NOTE — Transfer of Care (Signed)
Immediate Anesthesia Transfer of Care Note  Patient: Kenneth Shaw  Procedure(s) Performed: Procedure(s) (LRB): Right small finger dupuytrens partial palmar fasciectomy, proximal interphalangeal joint volar plate/capsular release (Right)  Patient Location: PACU  Anesthesia Type: General  Level of Consciousness: awake, alert  and oriented  Airway & Oxygen Therapy: Patient Spontanous Breathing and Patient connected to face mask oxygen  Post-op Assessment: Report given to PACU RN and Post -op Vital signs reviewed and stable  Post vital signs: Reviewed and stable  Complications: No apparent anesthesia complications  Last Vitals:  Vitals Value Taken Time  BP 159/72 02/08/22 1206  Temp 36.4 C 02/08/22 1204  Pulse 56 02/08/22 1208  Resp 21 02/08/22 1208  SpO2 98 % 02/08/22 1208  Vitals shown include unvalidated device data.  Last Pain:  Vitals:   02/08/22 1000  TempSrc: Oral  PainSc: 0-No pain      Patients Stated Pain Goal: 5 (43/88/87 5797)  Complications: No notable events documented.

## 2022-02-08 NOTE — Interval H&P Note (Deleted)
History and Physical Interval Note:  02/08/2022 10:39 AM  Kenneth Shaw  has presented today for surgery, with the diagnosis of RIGHT RING FINGER [name].  The various methods of treatment have been discussed with the patient and family. After consideration of risks, benefits and other options for treatment, the patient has consented to  Procedure(s) with comments: Right small finger dupuytrens partial palmar fasciectomy, proximal interphalangeal joint volar plate/capsular release (Right) - 54mn as a surgical intervention.  The patient's history has been reviewed, patient examined, no change in status, stable for surgery.  I have reviewed the patient's chart and labs.  Questions were answered to the patient's satisfaction.     [name]

## 2022-02-08 NOTE — Anesthesia Procedure Notes (Signed)
Procedure Name: LMA Insertion Date/Time: 02/08/2022 11:08 AM  Performed by: Mechele Claude, CRNAPre-anesthesia Checklist: Patient identified, Emergency Drugs available, Suction available and Patient being monitored Patient Re-evaluated:Patient Re-evaluated prior to induction Oxygen Delivery Method: Circle system utilized Preoxygenation: Pre-oxygenation with 100% oxygen Induction Type: IV induction Ventilation: Mask ventilation without difficulty LMA: LMA inserted LMA Size: 5.0 Number of attempts: 1 Airway Equipment and Method: Bite block Placement Confirmation: positive ETCO2 Tube secured with: Tape Dental Injury: Teeth and Oropharynx as per pre-operative assessment

## 2022-02-08 NOTE — Interval H&P Note (Deleted)
History and Physical Interval Note:  02/08/2022 12:07 PM  Kenneth Shaw  has presented today for surgery, with the diagnosis of RIGHT RING FINGER DUPUYTRENS CONTRACTURE.  The various methods of treatment have been discussed with the patient and family. After consideration of risks, benefits and other options for treatment, the patient has consented to  Procedure(s) with comments: Right small finger dupuytrens partial palmar fasciectomy, proximal interphalangeal joint volar plate/capsular release (Right) - 18mn as a surgical intervention.  The patient's history has been reviewed, patient examined, no change in status, stable for surgery.  I have reviewed the patient's chart and labs.  Questions were answered to the patient's satisfaction.     [name]

## 2022-02-08 NOTE — Progress Notes (Signed)
Assisted Dr. Hollis with right, supraclavicular, ultrasound guided block. Side rails up, monitors on throughout procedure. See vital signs in flow sheet. Tolerated Procedure well. 

## 2022-02-09 ENCOUNTER — Encounter (HOSPITAL_BASED_OUTPATIENT_CLINIC_OR_DEPARTMENT_OTHER): Payer: Self-pay | Admitting: Orthopedic Surgery

## 2022-02-09 ENCOUNTER — Other Ambulatory Visit: Payer: Self-pay | Admitting: Urology

## 2022-02-09 LAB — SURGICAL PATHOLOGY

## 2022-04-05 ENCOUNTER — Encounter (HOSPITAL_COMMUNITY): Payer: Self-pay

## 2022-04-05 NOTE — Patient Instructions (Signed)
SURGICAL WAITING ROOM VISITATION Patients having surgery or a procedure may have no more than 2 support people in the waiting area - these visitors may rotate.   Children under the age of 24 must have an adult with them who is not the patient. If the patient needs to stay at the hospital during part of their recovery, the visitor guidelines for inpatient rooms apply. Pre-op nurse will coordinate an appropriate time for 1 support person to accompany patient in pre-op.  This support person may not rotate.    Please refer to the [facility] website for the visitor guidelines for Inpatients (after your surgery is over and you are in a regular room).      Your procedure is scheduled on: 04-19-22   Report to [facility] Main Entrance    Report to admitting at 5:15 AM   Call this number if you have problems the morning of surgery [phone]   Do not eat food or drink liquids:After Midnight.          If you have questions, please contact your surgeon's office.   FOLLOW ANY ADDITIONAL PRE OP INSTRUCTIONS YOU RECEIVED FROM YOUR SURGEON'S OFFICE!!!     Oral Hygiene is also important to reduce your risk of infection.                                    Remember - BRUSH YOUR TEETH THE MORNING OF SURGERY WITH YOUR REGULAR TOOTHPASTE   Do NOT smoke after Midnight   Take these medicines the morning of surgery with A SIP OF WATER: Ezetimibe Levothyroxine Oxybutynin   DO NOT TAKE ANY ORAL DIABETIC MEDICATIONS DAY OF YOUR SURGERY  Bring CPAP mask and tubing day of surgery.                              You may not have any metal on your body including  jewelry, and body piercing             Do not wear  lotions, powders, cologne, or deodorant              Men may shave face and neck.   Do not bring valuables to the hospital. [place].   Contacts, dentures or bridgework may not be worn into surgery.   Bring small overnight bag day of surgery.    DO NOT [place]. PHARMACY WILL DISPENSE MEDICATIONS LISTED ON YOUR MEDICATION LIST TO YOU DURING YOUR ADMISSION [place]!    Special Instructions: Bring a copy of your healthcare power of attorney and living will documents the day of surgery if you haven't scanned them before.              Please read over the following fact sheets you were given: IF [place]  If you received a COVID test during your pre-op visit  it is requested that you wear a mask when out in public, stay away from anyone that may not be feeling well and notify your surgeon if you develop symptoms. If you test positive for Covid or have been in contact with anyone that has tested positive in the last 10 days please notify you surgeon.  [place] - Preparing for  Surgery Before surgery, you can play an important role.  Because skin is not sterile, your skin needs to be as free of germs as possible.  You can reduce the number of germs on your skin by washing with CHG (chlorahexidine gluconate) soap before surgery.  CHG is an antiseptic cleaner which kills germs and bonds with the skin to continue killing germs even after washing. Please DO NOT use if you have an allergy to CHG or antibacterial soaps.  If your skin becomes reddened/irritated stop using the CHG and inform your nurse when you arrive at Short Stay. Do not shave (including legs and underarms) for at least 48 hours prior to the first CHG shower.  You may shave your face/neck.  Please follow these instructions carefully:  1.  Shower with CHG Soap the night before surgery and the  morning of surgery.  2.  If you choose to wash your hair, wash your hair first as usual with your normal  shampoo.  3.  After you shampoo, rinse your hair and body thoroughly to remove the shampoo.                             4.  Use CHG as you would any other liquid soap.  You  can apply chg directly to the skin and wash.  Gently with a scrungie or clean washcloth.  5.  Apply the CHG Soap to your body ONLY FROM THE NECK DOWN.   Do   not use on face/ open                           Wound or open sores. Avoid contact with eyes, ears mouth and   genitals (private parts).                       Wash face,  Genitals (private parts) with your normal soap.             6.  Wash thoroughly, paying special attention to the area where your    surgery  will be performed.  7.  Thoroughly rinse your body with warm water from the neck down.  8.  DO NOT shower/wash with your normal soap after using and rinsing off the CHG Soap.                9.  Pat yourself dry with a clean towel.            10.  Wear clean pajamas.            11.  Place clean sheets on your bed the night of your first shower and do not  sleep with pets. Day of Surgery : Do not apply any lotions/deodorants the morning of surgery.  Please wear clean clothes to the hospital/surgery center.  FAILURE TO FOLLOW THESE INSTRUCTIONS MAY RESULT IN THE CANCELLATION OF YOUR SURGERY  PATIENT SIGNATURE_________________________________  NURSE SIGNATURE__________________________________  ________________________________________________________________________

## 2022-04-07 ENCOUNTER — Other Ambulatory Visit: Payer: Self-pay

## 2022-04-07 ENCOUNTER — Encounter (HOSPITAL_COMMUNITY)
Admission: RE | Admit: 2022-04-07 | Discharge: 2022-04-07 | Disposition: A | Discharge status: Home / Self Care | Payer: Medicare Other | Source: Ambulatory Visit | Attending: Urology | Admitting: Urology

## 2022-04-07 ENCOUNTER — Encounter (HOSPITAL_COMMUNITY): Payer: Self-pay

## 2022-04-07 VITALS — BP 137/88 | HR 59 | Temp 98.4°F | Resp 16 | Ht 75.0 in | Wt 241.3 lb

## 2022-04-07 DIAGNOSIS — E119 Type 2 diabetes mellitus without complications: Secondary | ICD-10-CM | POA: Insufficient documentation

## 2022-04-07 DIAGNOSIS — R8271 Bacteriuria: Secondary | ICD-10-CM | POA: Insufficient documentation

## 2022-04-07 DIAGNOSIS — R7303 Prediabetes: Secondary | ICD-10-CM

## 2022-04-07 DIAGNOSIS — Z01818 Encounter for other preprocedural examination: Secondary | ICD-10-CM | POA: Diagnosis present

## 2022-04-07 HISTORY — DX: Type 2 diabetes mellitus without complications: E11.9

## 2022-04-07 LAB — CBC
HCT: 46.2 % (ref 39.0–52.0)
Hemoglobin: 15.6 g/dL (ref 13.0–17.0)
MCH: 31.3 pg (ref 26.0–34.0)
MCHC: 33.8 g/dL (ref 30.0–36.0)
MCV: 92.8 fL (ref 80.0–100.0)
Platelets: 206 10*3/uL (ref 150–400)
RBC: 4.98 MIL/uL (ref 4.22–5.81)
RDW: 13.2 % (ref 11.5–15.5)
WBC: 5.9 10*3/uL (ref 4.0–10.5)
nRBC: 0 % (ref 0.0–0.2)

## 2022-04-07 LAB — BASIC METABOLIC PANEL
Anion gap: 8 (ref 5–15)
BUN: 19 mg/dL (ref 8–23)
CO2: 27 mmol/L (ref 22–32)
Calcium: 9.3 mg/dL (ref 8.9–10.3)
Chloride: 104 mmol/L (ref 98–111)
Creatinine, Ser: 1.08 mg/dL (ref 0.61–1.24)
GFR, Estimated: >60 mL/min (ref >60)
Glucose, Bld: 129 mg/dL — ABNORMAL HIGH (ref 70–99)
Potassium: 4 mmol/L (ref 3.5–5.1)
Sodium: 139 mmol/L (ref 135–145)

## 2022-04-07 LAB — HEMOGLOBIN A1C
Hgb A1c MFr Bld: 6.1 % — ABNORMAL HIGH (ref 4.8–5.6)
Mean Plasma Glucose: 128.37 mg/dL

## 2022-04-07 LAB — GLUCOSE, CAPILLARY: Glucose-Capillary: 136 mg/dL — ABNORMAL HIGH (ref 70–99)

## 2022-04-07 NOTE — Progress Notes (Signed)
COVID Vaccine Completed:  No  Date of COVID positive in last 90 days:  No  PCP - [name], MD Cardiologist - N/A  Chest x-ray -  N/A EKG - 04-07-22 Epic Stress Test -  N/A ECHO -  N/A Cardiac Cath -  N/A Pacemaker/ICD device last checked: Spinal Cord Stimulator: N/A Cardiac CT - 2020 Epic  Bowel Prep - N/A  Sleep Study - Yes, +sleep apnea CPAP - Yes  Fasting Blood Sugar - 115 to 150 Checks Blood Sugar  - 3-4 times a day   Blood Thinner Instructions:   Aspirin Instructions:  ASA 81.  Patient will stop a week before surgery per patient Last Dose:  Activity level:  Can go up a flight of stairs and perform activities of daily living without stopping and without symptoms of chest pain or shortness of breath.  Able to exercise without symptoms  Anesthesia review: N/A  Patient denies shortness of breath, fever, cough and chest pain at PAT appointment  Patient verbalized understanding of instructions that were given to them at the PAT appointment. Patient was also instructed that they will need to review over the PAT instructions again at home before surgery.

## 2022-04-08 LAB — URINE CULTURE: Culture: NO GROWTH

## 2022-04-18 MED ORDER — GENTAMICIN SULFATE 40 MG/ML IJ SOLN
5.0000 mg/kg | Freq: Once | INTRAVENOUS | Status: AC
  Administered 2022-04-19: 470 mg via INTRAVENOUS
  Filled 2022-04-18: qty 11.75

## 2022-04-18 NOTE — Anesthesia Preprocedure Evaluation (Addendum)
Anesthesia Evaluation  Patient identified by MRN, date of birth, ID band Patient awake    Reviewed: Allergy & Precautions, NPO status , Patient's Chart, lab work & pertinent test results  Airway Mallampati: II  TM Distance: >3 FB Neck ROM: Full    Dental  (+) Dental Advisory Given, Teeth Intact   Pulmonary sleep apnea , former smoker   Pulmonary exam normal breath sounds clear to auscultation       Cardiovascular negative cardio ROS Normal cardiovascular exam Rhythm:Regular Rate:Normal     Neuro/Psych  PSYCHIATRIC DISORDERS         GI/Hepatic negative GI ROS, Neg liver ROS,,,  Endo/Other  diabetesHypothyroidism    Renal/GU negative Renal ROS     Musculoskeletal  (+) Arthritis ,    Abdominal   Peds  Hematology   Anesthesia Other Findings   Reproductive/Obstetrics                             Anesthesia Physical Anesthesia Plan  ASA: 2  Anesthesia Plan:    Post-op Pain Management: Tylenol PO (pre-op)* and Minimal or no pain anticipated   Induction: Intravenous  PONV Risk Score and Plan: 3 and Ondansetron, Midazolam, Dexamethasone and Treatment may vary due to age or medical condition  Airway Management Planned: LMA and Oral ETT  Additional Equipment: None  Intra-op Plan:   Post-operative Plan: Extubation in OR  Informed Consent: I have reviewed the patients History and Physical, chart, labs and discussed the procedure including the risks, benefits and alternatives for the proposed anesthesia with the patient or authorized representative who has indicated his/her understanding and acceptance.     Dental advisory given  Plan Discussed with: CRNA  Anesthesia Plan Comments:         Anesthesia Quick Evaluation

## 2022-04-19 ENCOUNTER — Encounter (HOSPITAL_COMMUNITY): Payer: Self-pay | Admitting: Urology

## 2022-04-19 ENCOUNTER — Observation Stay (HOSPITAL_COMMUNITY)
Admission: RE | Admit: 2022-04-19 | Discharge: 2022-04-20 | Disposition: A | Discharge status: Home / Self Care | Payer: Medicare Other | Attending: Urology | Admitting: Urology

## 2022-04-19 ENCOUNTER — Encounter (HOSPITAL_COMMUNITY)
Admission: RE | Disposition: A | Discharge status: Home / Self Care | Payer: Self-pay | Source: Home / Self Care | Attending: Urology

## 2022-04-19 ENCOUNTER — Ambulatory Visit (HOSPITAL_BASED_OUTPATIENT_CLINIC_OR_DEPARTMENT_OTHER): Payer: Medicare Other | Admitting: Anesthesiology

## 2022-04-19 ENCOUNTER — Ambulatory Visit (HOSPITAL_COMMUNITY): Payer: Medicare Other | Admitting: Anesthesiology

## 2022-04-19 ENCOUNTER — Other Ambulatory Visit: Payer: Self-pay

## 2022-04-19 DIAGNOSIS — Z8546 Personal history of malignant neoplasm of prostate: Secondary | ICD-10-CM

## 2022-04-19 DIAGNOSIS — G8918 Other acute postprocedural pain: Secondary | ICD-10-CM | POA: Diagnosis not present

## 2022-04-19 DIAGNOSIS — N393 Stress incontinence (female) (male): Principal | ICD-10-CM | POA: Insufficient documentation

## 2022-04-19 DIAGNOSIS — E039 Hypothyroidism, unspecified: Secondary | ICD-10-CM | POA: Diagnosis not present

## 2022-04-19 DIAGNOSIS — Z85828 Personal history of other malignant neoplasm of skin: Secondary | ICD-10-CM | POA: Diagnosis not present

## 2022-04-19 DIAGNOSIS — Z96652 Presence of left artificial knee joint: Secondary | ICD-10-CM | POA: Insufficient documentation

## 2022-04-19 DIAGNOSIS — E119 Type 2 diabetes mellitus without complications: Secondary | ICD-10-CM | POA: Diagnosis not present

## 2022-04-19 DIAGNOSIS — Z79891 Long term (current) use of opiate analgesic: Secondary | ICD-10-CM | POA: Diagnosis not present

## 2022-04-19 DIAGNOSIS — Z8616 Personal history of COVID-19: Secondary | ICD-10-CM | POA: Diagnosis not present

## 2022-04-19 DIAGNOSIS — Z79899 Other long term (current) drug therapy: Secondary | ICD-10-CM | POA: Diagnosis not present

## 2022-04-19 DIAGNOSIS — R7303 Prediabetes: Secondary | ICD-10-CM

## 2022-04-19 HISTORY — PX: URETHRAL SLING: SHX2621

## 2022-04-19 HISTORY — PX: CYSTOSCOPY: SHX5120

## 2022-04-19 LAB — GLUCOSE, CAPILLARY
Glucose-Capillary: 123 mg/dL — ABNORMAL HIGH (ref 70–99)
Glucose-Capillary: 126 mg/dL — ABNORMAL HIGH (ref 70–99)

## 2022-04-19 SURGERY — CREATION, URETHRAL SLING, MALE
Anesthesia: General

## 2022-04-19 MED ORDER — MIDAZOLAM HCL 2 MG/2ML IJ SOLN
INTRAMUSCULAR | Status: AC
  Filled 2022-04-19: qty 2

## 2022-04-19 MED ORDER — STERILE WATER FOR IRRIGATION IR SOLN
Status: DC | PRN
  Administered 2022-04-19: 3000 mL

## 2022-04-19 MED ORDER — MENTHOL 3 MG MT LOZG
1.0000 | LOZENGE | OROMUCOSAL | Status: DC | PRN
  Administered 2022-04-19: 3 mg via ORAL
  Filled 2022-04-19: qty 9

## 2022-04-19 MED ORDER — BUPIVACAINE HCL (PF) 0.5 % IJ SOLN
INTRAMUSCULAR | Status: DC | PRN
  Administered 2022-04-19: 15 mL

## 2022-04-19 MED ORDER — EPHEDRINE 5 MG/ML INJ
INTRAVENOUS | Status: AC
  Filled 2022-04-19: qty 5

## 2022-04-19 MED ORDER — ACETAMINOPHEN 500 MG PO TABS
1000.0000 mg | ORAL_TABLET | Freq: Four times a day (QID) | ORAL | Status: DC
  Administered 2022-04-19 – 2022-04-20 (×4): 1000 mg via ORAL
  Filled 2022-04-19 (×4): qty 2

## 2022-04-19 MED ORDER — LIDOCAINE 2% (20 MG/ML) 5 ML SYRINGE
INTRAMUSCULAR | Status: DC | PRN
  Administered 2022-04-19: 100 mg via INTRAVENOUS

## 2022-04-19 MED ORDER — LIDOCAINE HCL (PF) 2 % IJ SOLN
INTRAMUSCULAR | Status: AC
  Filled 2022-04-19: qty 5

## 2022-04-19 MED ORDER — DEXAMETHASONE SODIUM PHOSPHATE 10 MG/ML IJ SOLN
INTRAMUSCULAR | Status: AC
  Filled 2022-04-19: qty 1

## 2022-04-19 MED ORDER — FENTANYL CITRATE (PF) 100 MCG/2ML IJ SOLN
INTRAMUSCULAR | Status: AC
  Filled 2022-04-19: qty 2

## 2022-04-19 MED ORDER — OXYCODONE HCL 5 MG PO TABS
5.0000 mg | ORAL_TABLET | ORAL | Status: DC | PRN
  Administered 2022-04-19 – 2022-04-20 (×4): 5 mg via ORAL
  Filled 2022-04-19 (×4): qty 1

## 2022-04-19 MED ORDER — SULFAMETHOXAZOLE-TRIMETHOPRIM 800-160 MG PO TABS
1.0000 | ORAL_TABLET | Freq: Two times a day (BID) | ORAL | Status: DC
  Administered 2022-04-19 – 2022-04-20 (×3): 1 via ORAL
  Filled 2022-04-19 (×3): qty 1

## 2022-04-19 MED ORDER — ONDANSETRON HCL 4 MG/2ML IJ SOLN
INTRAMUSCULAR | Status: DC | PRN
  Administered 2022-04-19: 4 mg via INTRAVENOUS

## 2022-04-19 MED ORDER — FENTANYL CITRATE PF 50 MCG/ML IJ SOSY: 25.0000 ug | PREFILLED_SYRINGE | INTRAMUSCULAR | Status: DC | PRN

## 2022-04-19 MED ORDER — ORAL CARE MOUTH RINSE: 15.0000 mL | Freq: Once | OROMUCOSAL | Status: AC

## 2022-04-19 MED ORDER — BUPIVACAINE HCL (PF) 0.5 % IJ SOLN
INTRAMUSCULAR | Status: AC
  Filled 2022-04-19: qty 30

## 2022-04-19 MED ORDER — OXYCODONE HCL 5 MG PO TABS: 5.0000 mg | ORAL_TABLET | Freq: Once | ORAL | Status: DC | PRN

## 2022-04-19 MED ORDER — BERBERINE CHLORIDE 500 MG PO CAPS: 1000.0000 mg | ORAL_CAPSULE | Freq: Every evening | ORAL | Status: DC | PRN

## 2022-04-19 MED ORDER — SODIUM CHLORIDE 0.45 % IV SOLN: INTRAVENOUS | Status: DC

## 2022-04-19 MED ORDER — CELECOXIB 200 MG PO CAPS
200.0000 mg | ORAL_CAPSULE | Freq: Two times a day (BID) | ORAL | Status: DC
  Administered 2022-04-19 – 2022-04-20 (×3): 200 mg via ORAL
  Filled 2022-04-19 (×3): qty 1

## 2022-04-19 MED ORDER — VANCOMYCIN HCL 1500 MG/300ML IV SOLN
1500.0000 mg | INTRAVENOUS | Status: AC
  Administered 2022-04-19: 1500 mg via INTRAVENOUS
  Filled 2022-04-19: qty 300

## 2022-04-19 MED ORDER — CHLORHEXIDINE GLUCONATE 0.12 % MT SOLN
15.0000 mL | Freq: Once | OROMUCOSAL | Status: AC
  Administered 2022-04-19: 15 mL via OROMUCOSAL

## 2022-04-19 MED ORDER — LEVOTHYROXINE SODIUM 112 MCG PO TABS
112.0000 ug | ORAL_TABLET | Freq: Every day | ORAL | Status: DC
  Administered 2022-04-20: 112 ug via ORAL
  Filled 2022-04-19: qty 1

## 2022-04-19 MED ORDER — DIPHENHYDRAMINE HCL 25 MG PO CAPS
25.0000 mg | ORAL_CAPSULE | Freq: Four times a day (QID) | ORAL | Status: DC | PRN
  Administered 2022-04-19: 25 mg via ORAL
  Filled 2022-04-19: qty 1

## 2022-04-19 MED ORDER — DEXAMETHASONE SODIUM PHOSPHATE 10 MG/ML IJ SOLN
INTRAMUSCULAR | Status: DC | PRN
  Administered 2022-04-19: 10 mg via INTRAVENOUS

## 2022-04-19 MED ORDER — MEPERIDINE HCL 50 MG/ML IJ SOLN: 6.2500 mg | INTRAMUSCULAR | Status: DC | PRN

## 2022-04-19 MED ORDER — PROMETHAZINE HCL 25 MG/ML IJ SOLN: 6.2500 mg | INTRAMUSCULAR | Status: DC | PRN

## 2022-04-19 MED ORDER — LIDOCAINE HCL (PF) 1 % IJ SOLN
INTRAMUSCULAR | Status: DC | PRN
  Administered 2022-04-19: 15 mL

## 2022-04-19 MED ORDER — ACETAMINOPHEN 500 MG PO TABS
1000.0000 mg | ORAL_TABLET | Freq: Once | ORAL | Status: AC
  Administered 2022-04-19: 1000 mg via ORAL
  Filled 2022-04-19: qty 2

## 2022-04-19 MED ORDER — PROPOFOL 10 MG/ML IV BOLUS
INTRAVENOUS | Status: AC
  Filled 2022-04-19: qty 20

## 2022-04-19 MED ORDER — ONDANSETRON HCL 4 MG/2ML IJ SOLN
INTRAMUSCULAR | Status: AC
  Filled 2022-04-19: qty 2

## 2022-04-19 MED ORDER — ONDANSETRON HCL 4 MG/2ML IJ SOLN: 4.0000 mg | INTRAMUSCULAR | Status: DC | PRN

## 2022-04-19 MED ORDER — FENTANYL CITRATE (PF) 100 MCG/2ML IJ SOLN
INTRAMUSCULAR | Status: DC | PRN
  Administered 2022-04-19 (×5): 25 ug via INTRAVENOUS

## 2022-04-19 MED ORDER — OXYCODONE HCL 5 MG/5ML PO SOLN: 5.0000 mg | Freq: Once | ORAL | Status: DC | PRN

## 2022-04-19 MED ORDER — MIDAZOLAM HCL 5 MG/5ML IJ SOLN
INTRAMUSCULAR | Status: DC | PRN
  Administered 2022-04-19: 2 mg via INTRAVENOUS

## 2022-04-19 MED ORDER — 0.9 % SODIUM CHLORIDE (POUR BTL) OPTIME
TOPICAL | Status: DC | PRN
  Administered 2022-04-19: 1000 mL

## 2022-04-19 MED ORDER — PROPOFOL 10 MG/ML IV BOLUS
INTRAVENOUS | Status: DC | PRN
  Administered 2022-04-19: 160 mg via INTRAVENOUS

## 2022-04-19 MED ORDER — LIDOCAINE HCL (PF) 1 % IJ SOLN
INTRAMUSCULAR | Status: AC
  Filled 2022-04-19: qty 30

## 2022-04-19 MED ORDER — EPHEDRINE SULFATE-NACL 50-0.9 MG/10ML-% IV SOSY
PREFILLED_SYRINGE | INTRAVENOUS | Status: DC | PRN
  Administered 2022-04-19 (×2): 5 mg via INTRAVENOUS

## 2022-04-19 MED ORDER — LACTATED RINGERS IV SOLN: INTRAVENOUS | Status: DC

## 2022-04-19 MED ORDER — CHLORHEXIDINE GLUCONATE 4 % EX LIQD: Freq: Once | CUTANEOUS | Status: DC

## 2022-04-19 SURGICAL SUPPLY — 56 items
BAG COUNTER SPONGE SURGICOUNT (BAG) IMPLANT
BAG URINE DRAIN 2000ML AR STRL (UROLOGICAL SUPPLIES) ×1 IMPLANT
BAG URO CATCHER STRL LF (MISCELLANEOUS) ×1 IMPLANT
BLADE SURG 15 STRL LF DISP TIS (BLADE) ×1 IMPLANT
BLADE SURG 15 STRL SS (BLADE) ×1
BNDG GAUZE DERMACEA FLUFF 4 (GAUZE/BANDAGES/DRESSINGS) ×1 IMPLANT
BRIEF MESH DISP LRG (UNDERPADS AND DIAPERS) ×1 IMPLANT
CATH FOLEY 2WAY SLVR  5CC 14FR (CATHETERS) ×1
CATH FOLEY 2WAY SLVR 5CC 14FR (CATHETERS) ×1 IMPLANT
CATH URETL OPEN 5X70 (CATHETERS) ×1 IMPLANT
CHLORAPREP W/TINT 26 (MISCELLANEOUS) ×2 IMPLANT
CLOTH BEACON ORANGE TIMEOUT ST (SAFETY) ×1 IMPLANT
COVER MAYO STAND STRL (DRAPES) IMPLANT
COVER SURGICAL LIGHT HANDLE (MISCELLANEOUS) ×1 IMPLANT
DERMABOND ADVANCED .7 DNX12 (GAUZE/BANDAGES/DRESSINGS) ×2 IMPLANT
DRAPE SHEET LG 3/4 BI-LAMINATE (DRAPES) ×1 IMPLANT
DRAPE UNDERBUTTOCKS STRL (DISPOSABLE) ×1 IMPLANT
DRSG TELFA 3X8 NADH STRL (GAUZE/BANDAGES/DRESSINGS) ×1 IMPLANT
ELECT PENCIL ROCKER SW 15FT (MISCELLANEOUS) ×1 IMPLANT
GAUZE 4X4 16PLY ~~LOC~~+RFID DBL (SPONGE) ×2 IMPLANT
GAUZE PAD ABD 8X10 STRL (GAUZE/BANDAGES/DRESSINGS) IMPLANT
GLOVE BIO SURGEON STRL SZ7 (GLOVE) ×1 IMPLANT
GLOVE ECLIPSE 7.5 STRL STRAW (GLOVE) IMPLANT
GLOVE SURG LX STRL 7.5 STRW (GLOVE) ×2 IMPLANT
GOWN STRL REUS W/ TWL LRG LVL3 (GOWN DISPOSABLE) ×1 IMPLANT
GOWN STRL REUS W/ TWL XL LVL3 (GOWN DISPOSABLE) ×1 IMPLANT
GOWN STRL REUS W/TWL LRG LVL3 (GOWN DISPOSABLE) ×1
GOWN STRL REUS W/TWL XL LVL3 (GOWN DISPOSABLE) ×1
GUIDEWIRE ZIPWRE .038 STRAIGHT (WIRE) ×1 IMPLANT
HOLDER FOLEY CATH W/STRAP (MISCELLANEOUS) ×1 IMPLANT
KIT BASIN OR (CUSTOM PROCEDURE TRAY) ×1 IMPLANT
MANIFOLD NEPTUNE II (INSTRUMENTS) ×1 IMPLANT
NDL SPNL 22GX3.5 QUINCKE BK (NEEDLE) ×1 IMPLANT
NEEDLE HYPO 22GX1.5 SAFETY (NEEDLE) IMPLANT
NEEDLE SPNL 22GX3.5 QUINCKE BK (NEEDLE) ×1 IMPLANT
PACK CYSTO (CUSTOM PROCEDURE TRAY) ×1 IMPLANT
PLUG CATH AND CAP STER (CATHETERS) ×1 IMPLANT
PROTECTOR NERVE ULNAR (MISCELLANEOUS) ×2 IMPLANT
SHEET LAVH (DRAPES) ×1 IMPLANT
SLING ADVANCE MALE SYSTEM (Mesh General) IMPLANT
SPIKE FLUID TRANSFER (MISCELLANEOUS) ×1 IMPLANT
STAPLER VISISTAT 35W (STAPLE) ×1 IMPLANT
SUT MNCRL AB 4-0 PS2 18 (SUTURE) ×1 IMPLANT
SUT SILK 2 0 30  PSL (SUTURE) ×1
SUT SILK 2 0 30 PSL (SUTURE) IMPLANT
SUT VIC AB 2-0 SH 27 (SUTURE)
SUT VIC AB 2-0 SH 27X BRD (SUTURE) IMPLANT
SUT VIC AB 3-0 SH 27 (SUTURE) ×8
SUT VIC AB 3-0 SH 27XBRD (SUTURE) ×3 IMPLANT
SUT VIC AB 4-0 PS2 27 (SUTURE) IMPLANT
SUT VIC AB 4-0 RB1 27 (SUTURE) ×2
SUT VIC AB 4-0 RB1 27XBRD (SUTURE) ×2 IMPLANT
SYR 10ML LL (SYRINGE) ×1 IMPLANT
TOWEL OR 17X26 10 PK STRL BLUE (TOWEL DISPOSABLE) IMPLANT
TUBING CONNECTING 10 (TUBING) ×1 IMPLANT
YANKAUER SUCT BULB TIP 10FT TU (MISCELLANEOUS) ×2 IMPLANT

## 2022-04-19 NOTE — H&P (Signed)
H&P   History of Present Illness: Kenneth Shaw is a 66 y.o. year old with a history of post prostatectomy stress incontinence who presents today for insertion of a transobturator sling  Past Medical History:  Diagnosis Date   ADD (attention deficit disorder)    no official testing   Anisocoria    chronic pupil size difference   Arthritis    Basal cell cancer    Dr Denna Haggard 1 spot on chest yrs ago   Diabetes mellitus without complication ([place])    Diverticulosis    Dupuytren's contracture of both hands    fracture as child   left arm no surgery   Goiter    small  , checked by primary md dr Roque Cash [facility] at office visits q 4 months, last US done 03-23-2012 epic   History of COVID-19 04/2020   mild all symptoms resolved   History of kidney stones    Hx of adenomatous colonic polyps 02/10/2021   2 diminutive, recall 7-10   Hyperlipidemia    Hypothyroidism    Internal hemorrhoids    Prostate cancer ([place]) 2021   Sleep apnea    cpap autoset   Wears contact lenses     Past Surgical History:  Procedure Laterality Date   COLONOSCOPY  2023   Tics; [facility] x2   DUPUYTREN CONTRACTURE RELEASE Right 02/08/2022   Procedure: Right small finger dupuytrens partial palmar fasciectomy, proximal interphalangeal joint volar plate/capsular release;  Surgeon: [name], MD;  Location: [facility];  Service: Orthopedics;  Laterality: Right;  52mn   HEMORRHOID BANDING  2023   HERNIA REPAIR     bilateral inguinal  25 yrs ago per pt on 01-31-2022   KNEE ARTHROSCOPY W/ ACL RECONSTRUCTION  1980   Left Knee    KNEE SURGERY Left 2008   arthroscopy L knee, [facility]; Dr BTonita Cong  LYMPHADENECTOMY Bilateral 11/29/2019   Procedure: WITH INJECTION ICG DYE AND LYMPHADENECTOMY;  Surgeon: MAlexis Frock MD;  Location: [facility];  Service: Urology;  Laterality: Bilateral;   right forehead basal cell carcinoma removed  11/19/2020   ROBOT ASSISTED LAPAROSCOPIC RADICAL  PROSTATECTOMY N/A 11/29/2019   Procedure: XI ROBOTIC ASSISTED LAPAROSCOPIC RADICAL PROSTATECTOMY;  Surgeon: MAlexis Frock MD;  Location: [facility];  Service: Urology;  Laterality: N/A;  3 HRS   THRYOID BIOPSY     yrs ago   TONSILLECTOMY  as child   TOTAL KNEE ARTHROPLASTY  04/19/2012   Procedure: TOTAL KNEE ARTHROPLASTY;  Surgeon: FGearlean Alf MD;  Location: [facility];  Service: Orthopedics;  Laterality: Left;   VASECTOMY     late 1980's or early 1990's    Home Medications:  Current Meds  Medication Sig   Berberine Chloride (BERBERINE HCI PO) Take 1,000 mg by mouth in the morning and at bedtime.   Cholecalciferol (VITAMIN D3) 250 MCG (10000 UT) TABS Take 10,000 Units by mouth every evening.   Coenzyme Q10 (COQ10 PO) Take 1 capsule by mouth at bedtime.   ezetimibe (ZETIA) 10 MG tablet Take 10 mg by mouth daily.   indapamide (LOZOL) 1.25 MG tablet Take 1.25 mg by mouth daily.   levothyroxine (SYNTHROID) 112 MCG tablet Take 112 mcg by mouth daily before breakfast.   MAGNESIUM PO Take 1 tablet by mouth at bedtime.   Multiple Vitamin (MULTIVITAMIN WITH MINERALS) TABS tablet Take 1 tablet by mouth at bedtime. Men's Multivitamin   oxybutynin (DITROPAN-XL) 10 MG 24 hr tablet Take 10 mg by  mouth daily.   rosuvastatin (CRESTOR) 5 MG tablet Take 5 mg by mouth every Sunday.    vitamin E 180 MG (400 UNITS) capsule Take 800 mg by mouth at bedtime.   vitamin k 100 MCG tablet Take 100 mcg by mouth daily.   Zinc 50 MG TABS Take 100 mg by mouth every evening.    Allergies:  Allergies  Allergen Reactions   Xarelto [Rivaroxaban] Rash    Family History  Problem Relation Age of Onset   Diabetes Maternal Grandmother    Ovarian cancer Maternal Aunt    Sleep apnea Father    Bipolar disorder Sister    Hyperlipidemia Mother    Hypertension Mother    Thyroid disease Mother        hypothyroidism   Atrial fibrillation Mother    Heart attack Maternal Uncle        3 M uncles mid 2s   Heart attack  Maternal Grandfather        in 34s   Bone cancer Paternal Grandmother    Bone cancer Paternal Uncle    Stroke Paternal Grandfather        cns aneurysm   Prostate cancer Neg Hx    Colon cancer Neg Hx     Social History:  reports that he quit smoking about 39 years ago. His smoking use included cigarettes. He has a 16.00 pack-year smoking history. He has never used smokeless tobacco. He reports that he does not drink alcohol and does not use drugs.  ROS: A complete review of systems was performed.  All systems are negative except for pertinent findings as noted.  Physical Exam:  Vital signs in last 24 hours: Temp:  [97.8 F (36.6 C)] 97.8 F (36.6 C) (11/14 0554) Pulse Rate:  [60] 60 (11/14 0554) Resp:  [16] 16 (11/14 0554) BP: (113)/(76) 113/76 (11/14 0554) SpO2:  [97 %] 97 % (11/14 0554) Weight:  [109.5 kg] 109.5 kg (11/14 0528) Constitutional:  Alert and oriented, No acute distress Cardiovascular: Regular rate and rhythm, No JVD Respiratory: Normal respiratory effort, Lungs clear bilaterally GI: Abdomen is soft, nontender, nondistended, no abdominal masses GU: No CVA tenderness Lymphatic: No lymphadenopathy Neurologic: Grossly intact, no focal deficits Psychiatric: Normal mood and affect Drains/Tubes: none   Laboratory Data:  No results for input(s): "WBC", "HGB", "HCT", "PLT" in the last 72 hours.  No results for input(s): "NA", "K", "CL", "GLUCOSE", "BUN", "CALCIUM", "CREATININE" in the last 72 hours.  Invalid input(s): "CO3"   Results for orders placed or performed during the hospital encounter of 04/19/22 (from the past 24 hour(s))  Glucose, capillary     Status: Abnormal   Collection Time: 04/19/22  6:01 AM  Result Value Ref Range   Glucose-Capillary 123 (H) 70 - 99 mg/dL   Comment 1 Notify RN    Comment 2 Document in Chart    No results found for this or any previous visit (from the past 240 hour(s)).  Renal Function: No results for input(s): "CREATININE"  in the last 168 hours. Estimated Creatinine Clearance: 88.7 mL/min (by C-G formula based on SCr of 1.08 mg/dL).  Radiologic Imaging: No results found.  Assessment:  66 yo M with stress incontinence s/p prostatectomy  Plan:  To OR for sling insertion. Procedure and risks reviewed (bleeding, infection, damage to urethra/bladder, urinary retention, erosion, sling infection, pain)  Donald Pore, MD 04/19/2022, 6:36 AM  [facility] Pager: [phone]

## 2022-04-19 NOTE — Op Note (Addendum)
Preoperative diagnosis:  1. Stress urinary incontinence 2. History of prostate cancer s/p prostatectomy  Postoperative diagnosis: same  Procedure(s): 1. Insertion of male sling (advance XP) 2. Flexible cystoscopy  Surgeon: Dr. Donald Pore  Assistants: Dr Nicki Reaper Macdiarmid Dr Margarita Sermons  Anesthesia: General  Complications: none  EBL: 5 cc  Intraoperative findings: Good bulbar urethra displacement and urethral coaptation after placement and tightening of sling  Indication: Kenneth Shaw is a 66 y.o.yo M who with stress incontinence s/p prostatectomy. After an extensive discussion, he is interested in proceeding with a sling  Description of procedure:  After appropriate consent had been obtained, the patient was brought to the operative suite where anesthesia was induced. The patient was prepped and draped in the usual sterile fashion. Extra care was taken with leg positioning to minimize the risk of compartment syndrome neuropathy and deep vein thrombosis.  Preoperative antibiotics were given.  I an made approximately 5 cm incision in the perineum involving the lower aspect of the scrotum.  I dissected down through soft tissue and mobilized the subcutaneous tissue from the bulbospongiosus midline.  I then split the muscle in the midline with my usual technique. Using a curved cerebellar retractor I exposed nicely the bulbar urethra.  It was easy to see and feel the peroneal tendon that was sharply taken down with Metzenbaum scissors.  There was 2 to 4 cm of mobility of the bulbar urethra towards the patient's head.  I was very pleased with identification and mobilization. I placed a 3-0 Vicryl where the tendon was initially attached to the bulb  I was very diligent at the beginning of the case and throughout the case to feel the abductor tendon relative to the obturator foramen.  I marked the area of entrance of the foramen needle with a marking pen.  I used 22 French foramen needle  to find the inferior rami and then through the foramen itself below the tendon.  I kept the angle appropriate with the patient in Trendelenburg.  I made a scalpel incision on the patient's left side approximately 1 cm in length.  With appropriate exposure I placed my finger in the upper aspect of the triangle on the patient's left side.  With the described technique I passed a trocar initially placing it against the buttock at 45 degree angle.  With my thumb I did a double pop maneuver through the soft tissues layers and then dropping the handle and delivering the tip onto the pulp of my index finger on the patient's left side.  The foramen needle was easily passed and with correct orientation the mesh was attached.  I double checked the location of the needle and was excellent.  I gently rotated the trocar with the mesh sling coming back through the skin  I did the exact same procedure on the right.  The mesh was attached in correct orientation.  Gently pulling on both slings and with my finger between the bulb and the mesh I gently brought the mesh close to the bulbar urethra.   Before I sewed the mesh to the bulb I cystoscoped the patient to make certain there was not an injury.  The bladder mucosa and trigone were normal.  No injury to bladder neck or urethra. (Addendum - error deleted here).  There is no inflammation or infection associated  The 3-0 Vicryl that was initially placed through the sponge was brought out through the distal aspect of the mesh.  I then gently synched the  mesh down to the bulb.  I placed two 3-0 Vicryl's in the proximal aspect of the mesh through the bulbospongiosus.  I then passed another in the midline.  I was very happy with the orientation of the mesh.  With my finger pressing on the mesh I gently pulled the trocars on the left and on the right and gradually sinched the bulbar urethra rotating it towards the patient's head approximately 50% of the final rotational  distance.  My assistant placed the cystoscope just distal to the bladder neck and then I tensioned the sling.  I placed my finger in the appropriate location and I could see where it was indenting and coapting the urethra.  I gently pulled each side of the mesh slowly until the urethra occluded.  I did not pull further.  I was very happy with the coaptation of the urethra and stopped at that point  Visually and palpably there was excellent rotation of the bulbar urethra.  I then gently tied down the initial 3-0 Vicryl suture which was attaching the proximal aspect of the mesh to the urethra  The sling was cut below each blue dot.  It had been rolled by my assistant prior to help for its release.  I irrigated.  I placed a hemostat deeply across the silicone and Johns sheath removing them easily.  This was done on both sides.  The mesh was cut below the skin level in the inguinal creases.  I closed each inguinal incision with 2 interrupted 4-0 Vicryl followed by Dermabond.  A 4 layer closure was used for the perineum.  I was cognizant of the scrotal aspect of the closure with 3-0 Vicryl that also included reapproximation of the bulbospongiosus muscle.  The next layer was deeper subcutaneous suture with 3-0 Vicryl.  A third layer was close to the skin with level with 3-0 Vicryl.  4-0 running mattress sutures used for the perineum.  Dermabond was applied with fluff dressing  The Foley catheter was draining well at the end of the case.  Leg position was good. I was very pleased with the surgery and hopefully it reaches the patient's treatment goal  Donald Pore MD 04/19/2022, 9:30 AM  [facility]  Pager: [phone]

## 2022-04-19 NOTE — Anesthesia Postprocedure Evaluation (Signed)
Anesthesia Post Note  Patient: Kenneth Shaw  Procedure(s) Performed: MALE SLING INSERTION CYSTOSCOPY     Patient location during evaluation: PACU Anesthesia Type: General Level of consciousness: sedated and patient cooperative Pain management: pain level controlled Vital Signs Assessment: post-procedure vital signs reviewed and stable Respiratory status: spontaneous breathing Cardiovascular status: stable Anesthetic complications: no   No notable events documented.  Last Vitals:  Vitals:   04/19/22 1145 04/19/22 1423  BP: 121/68 113/72  Pulse: 63 72  Resp: 14 16  Temp: 36.6 C 36.6 C  SpO2: 99% 97%    Last Pain:  Vitals:   04/19/22 1423  TempSrc: Oral  PainSc:                  Nolon Nations

## 2022-04-19 NOTE — Transfer of Care (Signed)
Immediate Anesthesia Transfer of Care Note  Patient: Kenneth Shaw  Procedure(s) Performed: MALE SLING INSERTION CYSTOSCOPY  Patient Location: PACU  Anesthesia Type:General  Level of Consciousness: awake and alert   Airway & Oxygen Therapy: Patient Spontanous Breathing and Patient connected to face mask oxygen  Post-op Assessment: Report given to RN and Post -op Vital signs reviewed and stable  Post vital signs: Reviewed and stable  Last Vitals:  Vitals Value Taken Time  BP    Temp    Pulse 65 04/19/22 0942  Resp 16 04/19/22 0942  SpO2 100 % 04/19/22 0942  Vitals shown include unvalidated device data.  Last Pain:  Vitals:   04/19/22 0554  TempSrc: Oral         Complications: No notable events documented.

## 2022-04-19 NOTE — Progress Notes (Signed)
PHARMACIST - PHYSICIAN ORDER COMMUNICATION  CONCERNING: P&T Medication Policy on Herbal Medications  DESCRIPTION:  This patient's order for:  berberine  has been noted.  This product(s) is classified as an "herbal" or natural product. Due to a lack of definitive safety studies or FDA approval, nonstandard manufacturing practices, plus the potential risk of unknown drug-drug interactions while on inpatient medications, the Pharmacy and Therapeutics Committee does not permit the use of "herbal" or natural products of this type within [facility].   ACTION TAKEN: The pharmacy department is unable to verify this order at this time. Please reevaluate patient's clinical condition at discharge and address if the herbal or natural product(s) should be resumed at that time.  Lenis Noon, PharmD 04/19/22 12:03 PM

## 2022-04-19 NOTE — Discharge Instructions (Signed)
Male Sling Post Operative Instructions Dr. Donald Pore  You may notice some swelling or black and blue bruising. This is very common, and may increase slightly over the next several days. Typically it will begin to improve 1-2 weeks after surgery You may take a shower 48 hours after surgery. Avoid submerging yourself completely in water (bath tubs, hot tubs, swimming pools, etc) until 1 month after the surgery. To clean the incision, let soapy water gently wash over the area and pat lightly. Use supportive, tight-fitting underwear for the first two weeks after surgery. For example, jock straps, sliding shorts, or briefs Apply ice packs 20 minutes on/20 minutes off for at least the first 2 days following surgery. This will help  minimize swelling and discomfort. After the first few days you can continue using ice packs if they are helpful  The skin incision is closed with sutures and purple skin glue. Both of these things dissolve on their own over the course of several weeks.   Avoid lifting anything heavier than 15 pounds for 6 weeks Do not put any direct pressure on the incision for long periods of time for the first 6 weeks following surgery. For example, do not ride a bicycle, motorcycle, ATV, or horse.  Sitting on a soft cushion, "donut" cushion, or pillow can help with the discomfort Avoid all sexual contact for 2 weeks following the surgery. You will be prescribed an antibiotic following the surgery; please take this as prescribed.  For pain post-operatively, you will typically be prescribed 2 medicines. The first is an anti-inflammatory medication (Celebrex, Toradol, Meloxicam). The second is narcotic pain medication (Tramadol, Oxycodone). You can take these as needed, and can supplement with over the counter tylenol. I recommend limiting the amount of narcotic pain medication you take as these medications can cause constipation and in rare cases, addiction.

## 2022-04-19 NOTE — Anesthesia Procedure Notes (Signed)
Procedure Name: LMA Insertion Date/Time: 04/19/2022 7:29 AM  Performed by: Sharlette Dense, CRNAPatient Re-evaluated:Patient Re-evaluated prior to induction Oxygen Delivery Method: Circle system utilized Preoxygenation: Pre-oxygenation with 100% oxygen Induction Type: IV induction Ventilation: Mask ventilation without difficulty LMA: LMA inserted LMA Size: 5.0 Number of attempts: 1 Placement Confirmation: positive ETCO2 and breath sounds checked- equal and bilateral Tube secured with: Tape Dental Injury: Teeth and Oropharynx as per pre-operative assessment

## 2022-04-20 ENCOUNTER — Encounter (HOSPITAL_COMMUNITY): Payer: Self-pay | Admitting: Urology

## 2022-04-20 DIAGNOSIS — N393 Stress incontinence (female) (male): Secondary | ICD-10-CM | POA: Diagnosis not present

## 2022-04-20 MED ORDER — OXYCODONE HCL 5 MG PO TABS: 5.0000 mg | ORAL_TABLET | Freq: Four times a day (QID) | ORAL | 0 refills | Status: AC | PRN

## 2022-04-20 MED ORDER — SULFAMETHOXAZOLE-TRIMETHOPRIM 800-160 MG PO TABS: 1.0000 | ORAL_TABLET | Freq: Two times a day (BID) | ORAL | 0 refills | Status: AC

## 2022-04-20 MED ORDER — CELECOXIB 200 MG PO CAPS: 200.0000 mg | ORAL_CAPSULE | Freq: Two times a day (BID) | ORAL | 1 refills | Status: AC

## 2022-04-20 NOTE — Progress Notes (Addendum)
Pt discharged with new foley placed.  Given discharge instructions and cath care instructions.  Belongings returned and taken to front entrance by wheelchair.

## 2022-04-20 NOTE — Progress Notes (Signed)
  Transition of Care [facility]) Screening Note   Patient Details  Name: [name] Date of Birth: July 02, 1955   Transition of Care [facility]) CM/SW Contact:    Dessa Phi, RN Phone Number: 04/20/2022, 10:55 AM    Transition of Care Department [facility]) has reviewed patient and no TOC needs have been identified at this time. We will continue to monitor patient advancement through interdisciplinary progression rounds. If new patient transition needs arise, please place a TOC consult.

## 2022-04-20 NOTE — Discharge Summary (Signed)
Date of admission: 04/19/2022  Date of discharge: 04/20/2022  Admission diagnosis:  Post-op pain [G89.18], s/p insertion of transobturator sling (Advance XP)   Discharge diagnosis:  Post-op pain [G89.18]  Secondary diagnoses:   Active Ambulatory Problems    Diagnosis Date Noted   Hyperlipidemia LDL goal <100 05/11/2007   BPH without obstruction/lower urinary tract symptoms 10/28/2008   Dupuytren's contracture of both hands 04/28/2014   OSA on CPAP 04/07/2015   Routine general medical examination at a health care facility 04/06/2016   Acquired hypothyroidism 04/18/2017   Prostate cancer ([place]) 04/18/2017   Hx of adenomatous colonic polyps 02/10/2021   Prolapsed internal hemorrhoids, grade 3 03/24/2021   Resolved Ambulatory Problems    Diagnosis Date Noted   Multinodular goiter 03/03/2010   THYROID NODULE 03/12/2010   Diverticulosis of large intestine 10/28/2008   Type II diabetes mellitus with manifestations (Kenneth Shaw) 03/03/2010   Injury to ulnar nerve 10/28/2008   SKIN CANCER, HX OF 10/28/2008   DJD (degenerative joint disease) of knee 03/20/2012   OA (osteoarthritis) of knee 04/19/2012   Postop Hyponatremia 04/20/2012   Postop Acute blood loss anemia 04/20/2012   Low serum testosterone level 09/16/2013   Abnormal prostate by palpation 04/28/2014   Cervical paraspinal muscle spasm 06/21/2016   Memory loss 04/18/2017   Deficiency anemia 04/18/2017   Rectal bleeding 01/26/2021   Past Medical History:  Diagnosis Date   ADD (attention deficit disorder)    Anisocoria    Arthritis    Basal cell cancer    Diabetes mellitus without complication ([place])    Diverticulosis    fracture as child   Goiter    History of COVID-19 04/2020   History of kidney stones    Hyperlipidemia    Hypothyroidism    Internal hemorrhoids    Sleep apnea    Wears contact lenses      History and Physical: For full details, please see admission history and physical. Briefly, Kenneth Shaw is a 66  y.o. year old patient who was admitted s/p the aforementioned procedure.   Hospital Course: POD 1 his foley was removed. [name] was only able to void small amounts and bladder scan was >400 cc. A 14 fr catheter was replaced.   EXAM NAD, Aox3 CV: reg rate RESP: clear GU: incision c/d/I with dermabond, urine clear ABD: soft, ND, NT  On the day of discharge, the patient was tolerating a regular diet and their pain was well controlled. They were determined to be stable for discharge home with foley in place.   Laboratory values: No results for input(s): "HGB", "HCT" in the last 72 hours. No results for input(s): "CREATININE" in the last 72 hours.  Disposition: Home  Discharge medications:  Allergies as of 04/20/2022       Reactions   Xarelto [rivaroxaban] Rash        Medication List     TAKE these medications    BERBERINE HCI PO Take 1,000 mg by mouth in the morning and at bedtime.   celecoxib 200 MG capsule Commonly known as: CELEBREX Take 1 capsule (200 mg total) by mouth 2 (two) times daily.   COQ10 PO Take 1 capsule by mouth at bedtime.   ezetimibe 10 MG tablet Commonly known as: ZETIA Take 10 mg by mouth daily.   indapamide 1.25 MG tablet Commonly known as: LOZOL Take 1.25 mg by mouth daily.   levothyroxine 112 MCG tablet Commonly known as: SYNTHROID Take 112 mcg by mouth daily before  breakfast.   MAGNESIUM PO Take 1 tablet by mouth at bedtime.   multivitamin with minerals Tabs tablet Take 1 tablet by mouth at bedtime. Men's Multivitamin   oxybutynin 10 MG 24 hr tablet Commonly known as: DITROPAN-XL Take 10 mg by mouth daily.   oxyCODONE 5 MG immediate release tablet Commonly known as: Oxy IR/ROXICODONE Take 1 tablet (5 mg total) by mouth every 6 (six) hours as needed for severe pain.   rosuvastatin 5 MG tablet Commonly known as: CRESTOR Take 5 mg by mouth every 'Sunday.   sulfamethoxazole-trimethoprim 800-160 MG tablet Commonly known as:  BACTRIM DS Take 1 tablet by mouth 2 (two) times daily.   Vitamin D3 250 MCG (10000 UT) Tabs Take 10,000 Units by mouth every evening.   vitamin E 180 MG (400 UNITS) capsule Take 800 mg by mouth at bedtime.   vitamin k 100 MCG tablet Take 100 mcg by mouth daily.   Zinc 50 MG Tabs Take 100 mg by mouth every evening.               Discharge Care Instructions  (From admission, onward)           Start     Ordered   04/20/22 0000  If the dressing is still on your incision site when you go home, remove it on the third day after your surgery date. Remove dressing if it begins to fall off, or if it is dirty or damaged before the third day.        11'$ /15/23 0841            Followup:  1 week for voiding trial

## 2023-08-28 ENCOUNTER — Other Ambulatory Visit: Payer: Self-pay | Admitting: Endocrinology

## 2023-08-28 DIAGNOSIS — E1169 Type 2 diabetes mellitus with other specified complication: Secondary | ICD-10-CM

## 2023-09-11 ENCOUNTER — Encounter: Payer: Self-pay | Admitting: Endocrinology

## 2023-09-12 ENCOUNTER — Other Ambulatory Visit (HOSPITAL_COMMUNITY): Payer: Self-pay | Admitting: Endocrinology

## 2023-09-12 DIAGNOSIS — E1169 Type 2 diabetes mellitus with other specified complication: Secondary | ICD-10-CM

## 2023-09-15 ENCOUNTER — Other Ambulatory Visit

## 2023-10-12 ENCOUNTER — Ambulatory Visit (HOSPITAL_BASED_OUTPATIENT_CLINIC_OR_DEPARTMENT_OTHER)
Admission: RE | Admit: 2023-10-12 | Discharge: 2023-10-12 | Disposition: A | Discharge status: Home / Self Care | Payer: Self-pay | Source: Ambulatory Visit | Attending: Endocrinology | Admitting: Endocrinology

## 2023-10-12 DIAGNOSIS — E1169 Type 2 diabetes mellitus with other specified complication: Secondary | ICD-10-CM | POA: Insufficient documentation

## 2024-04-16 ENCOUNTER — Other Ambulatory Visit: Payer: Self-pay | Admitting: Orthopedic Surgery

## 2024-04-17 LAB — SURGICAL PATHOLOGY
